# Patient Record
Sex: Female | Born: 1955 | Race: White | Hispanic: No | Marital: Married | Smoking: Never smoker
Health system: Southern US, Community
[De-identification: ages and names within clinical notes are randomized; demographics above are authoritative.]

## PROBLEM LIST (undated history)

## (undated) HISTORY — PX: CHOLECYSTECTOMY: SHX55

## (undated) HISTORY — PX: ANKLE SURGERY: SHX546

## (undated) HISTORY — PX: WRIST SURGERY: SHX841

---

## 1998-07-01 ENCOUNTER — Other Ambulatory Visit: Admission: RE | Admit: 1998-07-01 | Discharge: 1998-07-01 | Payer: Self-pay | Admitting: Obstetrics and Gynecology

## 1999-08-23 ENCOUNTER — Other Ambulatory Visit: Admission: RE | Admit: 1999-08-23 | Discharge: 1999-08-23 | Payer: Self-pay | Admitting: Obstetrics and Gynecology

## 2000-05-01 ENCOUNTER — Encounter: Admission: RE | Admit: 2000-05-01 | Discharge: 2000-05-01 | Payer: Self-pay | Admitting: Obstetrics and Gynecology

## 2000-05-01 ENCOUNTER — Encounter: Payer: Self-pay | Admitting: Obstetrics and Gynecology

## 2000-09-26 ENCOUNTER — Other Ambulatory Visit: Admission: RE | Admit: 2000-09-26 | Discharge: 2000-09-26 | Payer: Self-pay | Admitting: Obstetrics and Gynecology

## 2001-10-07 ENCOUNTER — Other Ambulatory Visit: Admission: RE | Admit: 2001-10-07 | Discharge: 2001-10-07 | Payer: Self-pay | Admitting: Obstetrics and Gynecology

## 2001-12-03 HISTORY — PX: BREAST BIOPSY: SHX20

## 2002-11-20 ENCOUNTER — Encounter: Admission: RE | Admit: 2002-11-20 | Discharge: 2002-11-20 | Payer: Self-pay | Admitting: Obstetrics and Gynecology

## 2002-11-20 ENCOUNTER — Encounter (INDEPENDENT_AMBULATORY_CARE_PROVIDER_SITE_OTHER): Payer: Self-pay | Admitting: Specialist

## 2002-11-20 ENCOUNTER — Encounter: Payer: Self-pay | Admitting: Obstetrics and Gynecology

## 2002-12-30 ENCOUNTER — Other Ambulatory Visit: Admission: RE | Admit: 2002-12-30 | Discharge: 2002-12-30 | Payer: Self-pay | Admitting: Obstetrics and Gynecology

## 2003-12-31 ENCOUNTER — Other Ambulatory Visit: Admission: RE | Admit: 2003-12-31 | Discharge: 2003-12-31 | Payer: Self-pay | Admitting: Obstetrics and Gynecology

## 2005-08-07 ENCOUNTER — Other Ambulatory Visit: Admission: RE | Admit: 2005-08-07 | Discharge: 2005-08-07 | Payer: Self-pay | Admitting: Obstetrics and Gynecology

## 2009-02-01 ENCOUNTER — Ambulatory Visit (HOSPITAL_COMMUNITY): Admission: RE | Admit: 2009-02-01 | Discharge: 2009-02-01 | Payer: Self-pay | Admitting: Family Medicine

## 2009-05-09 ENCOUNTER — Encounter (INDEPENDENT_AMBULATORY_CARE_PROVIDER_SITE_OTHER): Payer: Self-pay | Admitting: General Surgery

## 2009-05-09 ENCOUNTER — Ambulatory Visit (HOSPITAL_COMMUNITY): Admission: RE | Admit: 2009-05-09 | Discharge: 2009-05-09 | Payer: Self-pay | Admitting: General Surgery

## 2011-03-12 LAB — COMPREHENSIVE METABOLIC PANEL
ALT: 25 U/L (ref 0–35)
AST: 22 U/L (ref 0–37)
Albumin: 3.8 g/dL (ref 3.5–5.2)
Alkaline Phosphatase: 69 U/L (ref 39–117)
BUN: 11 mg/dL (ref 6–23)
CO2: 27 mEq/L (ref 19–32)
Calcium: 9.4 mg/dL (ref 8.4–10.5)
Chloride: 105 mEq/L (ref 96–112)
Creatinine, Ser: 0.81 mg/dL (ref 0.4–1.2)
GFR calc Af Amer: >60 mL/min (ref >60)
GFR calc non Af Amer: >60 mL/min (ref >60)
Glucose, Bld: 130 mg/dL — ABNORMAL HIGH (ref 70–99)
Potassium: 4.8 mEq/L (ref 3.5–5.1)
Sodium: 138 mEq/L (ref 135–145)
Total Bilirubin: 0.7 mg/dL (ref 0.3–1.2)
Total Protein: 6.7 g/dL (ref 6.0–8.3)

## 2011-03-12 LAB — CBC
HCT: 38.5 % (ref 36.0–46.0)
Hemoglobin: 12.9 g/dL (ref 12.0–15.0)
MCHC: 33.6 g/dL (ref 30.0–36.0)
MCV: 93.4 fL (ref 78.0–100.0)
Platelets: 174 10*3/uL (ref 150–400)
RBC: 4.12 MIL/uL (ref 3.87–5.11)
RDW: 13.4 % (ref 11.5–15.5)
WBC: 5.7 10*3/uL (ref 4.0–10.5)

## 2011-03-12 LAB — DIFFERENTIAL
Basophils Absolute: 0 10*3/uL (ref 0.0–0.1)
Basophils Relative: 1 % (ref 0–1)
Eosinophils Absolute: 0.1 10*3/uL (ref 0.0–0.7)
Eosinophils Relative: 3 % (ref 0–5)
Lymphocytes Relative: 37 % (ref 12–46)
Lymphs Abs: 2.1 10*3/uL (ref 0.7–4.0)
Monocytes Absolute: 0.4 10*3/uL (ref 0.1–1.0)
Monocytes Relative: 7 % (ref 3–12)
Neutro Abs: 3 10*3/uL (ref 1.7–7.7)
Neutrophils Relative %: 53 % (ref 43–77)

## 2011-03-12 LAB — PREGNANCY, URINE: Preg Test, Ur: NEGATIVE

## 2011-04-17 NOTE — Op Note (Signed)
NAME:  Cindy Roman, Cindy Roman                ACCOUNT NO.:  [id number]   MEDICAL RECORD NO.:  [id number]          PATIENT TYPE:  AMB   LOCATION:  DAY                          FACILITY:  [facility]   PHYSICIAN:  Anselm Pancoast. Weatherly, M.D.DATE OF BIRTH:  Aug 25, 1956   DATE OF PROCEDURE:  05/09/2009  DATE OF DISCHARGE:                               OPERATIVE REPORT   PREOPERATIVE DIAGNOSES:  Biliary dyskinesia with possible small  gallstones.   POSTOPERATIVE DIAGNOSIS:  Biliary dyskinesia with a single small  cholesterol stone within the wall of the gallbladder.   OPERATIONS:  Laparoscopic cholecystectomy with cholangiogram.   SURGEON:  Anselm Pancoast. Zachery Dakins, M.D.   ASSISTANT:  Dr. Glenna Fellows.   General anesthesia.   HISTORY:  Cindy Roman is a 55 year old Caucasian female who has had  recurrent episodes of epigastric pain.  They appear not very frequent.  First started having probably 4 or 5 years ago and then more recently a  little bit more frequent than once a year.  The pain usually starts in  the epigastric area, then will radiate to the small her back, more  intense pain, and she had an ultrasound of her gallbladder approximately  2 years ago that did not show any stone.  At that time, they saw her  medical physician, Dr. Laurann Montana.  They did lab studies which were  normal, and they did a hepatobiliary scan that showed a 60% ejection  fraction.  She did not want to proceed on with a cholecystectomy since  the x-ray did not show gallstones, but then she had a more intense pain  recently and recommended that she see a Development worker, international aid.   On review of her symptoms, her symptoms are definitely typical for  gallbladder problems, and I did not repeat the liver function studies  then, but I saw her back in March, and then she had a repeat episode of  pain more recently and wanted to proceed on with a cholecystectomy.  She  is here for the planned procedure and states that she has not  actually  had a further episode of pain since I saw the office approximately 2  weeks ago.  Her repeat liver function studies are again normal.  Her  white count is normal, and she is allergic to PENICILLIN and was given  Cipro.   Induction of general anesthesia, endotracheal tube, oral tube into the  stomach, and she has a little small fascial defect at the umbilicus, and  I made the incision really right over the umbilicus, so I could go  through this little fascial defect.  There was a little preperitoneal  fatty tissue that protruded up, and we actually removed that and put a  tie Vicryl on the actual peritoneal area and then put the Hasson cannula  through the little fascial defect.  The gallbladder was visualized with  inflation of the CO2.  There were a lot of chronic thickening and  adhesions around it.  The upper 10 mL trocar was placed under direct  vision.  Two trocars were placed lateral after anesthetizing the fascia.  The gallbladder was retracted outward and laterally, and the adhesions  to the duodenum and the omentum were carefully dissected from this.  Hemostasis obtained with cautery.  In the proximal portion of the  gallbladder, you could see the area where the gallbladder was possibly  kind of twisted up under the biliary system, kind of if the gallbladder  would get overly distended, I am sure it would partially cause these  symptoms that were possibly biliary dyskinesia.  The gallbladder was  retracted upward and outward.  The cystic duct was small, and cystic  artery had been identified, and I doubly clipped it proximally, singly  and distally, and did not divide it until after the cholangiogram was  obtained.  The little incision was made in the cystic duct proximal to  the clip, cholangiocatheter inserted.  X-ray obtained after holding in  place with clip, and the extrahepatic biliary system was very small,  good flow into the duodenum, and you could see the  left and right  radical of the intrahepatic ducts.  This catheter was removed.  The  cystic duct was triply clipped and then divided.  The posterior branch  of the cystic artery was like was identified, doubly clipped proximally,  and then divided, and the gallbladder was removed from its bed using the  hook electrocautery.  The gallbladder, I opened at the completion of  surgery, and there was one small cholesterol stone sort of embedded in  the most distal portion of the gallbladder, and she did not have mark  cholesterolosis of the surface of the gallbladder wall.  The bed had  been irrigated, slightly touch a couple of areas with the spatula  electrocautery and then withdrew the bag containing the gallbladder  through the fascia at the umbilicus.  I then put two figure-of-eight  Vicryls in the fascia at the umbilicus with good hemostasis.  Then  anesthetized the fascia at the umbilicus with Marcaine with adrenaline.  All total about 25 mL of this was used.  The subcutaneous wounds were  closed with 4-0 Vicryl, Benzoin, and Steri-Strips.  I put a single  fascia suture in the subxiphoid trocar site.  The patient tolerated the  procedure nicely.  I think she wants to go home this afternoon, and she  will be seen in the office in approximately 2 weeks.      Anselm Pancoast. Zachery Dakins, M.D.  Electronically Signed     WJW/MEDQ  D:  05/09/2009  T:  05/09/2009  Job:  [id number]

## 2014-07-07 ENCOUNTER — Emergency Department (HOSPITAL_BASED_OUTPATIENT_CLINIC_OR_DEPARTMENT_OTHER): Payer: 59

## 2014-07-07 ENCOUNTER — Encounter (HOSPITAL_BASED_OUTPATIENT_CLINIC_OR_DEPARTMENT_OTHER): Payer: Self-pay | Admitting: Emergency Medicine

## 2014-07-07 ENCOUNTER — Emergency Department (HOSPITAL_BASED_OUTPATIENT_CLINIC_OR_DEPARTMENT_OTHER)
Admission: EM | Admit: 2014-07-07 | Discharge: 2014-07-07 | Disposition: A | Discharge status: Home / Self Care | Payer: 59 | Attending: Emergency Medicine | Admitting: Emergency Medicine

## 2014-07-07 DIAGNOSIS — S52599A Other fractures of lower end of unspecified radius, initial encounter for closed fracture: Secondary | ICD-10-CM | POA: Diagnosis not present

## 2014-07-07 DIAGNOSIS — Z880 Allergy status to penicillin: Secondary | ICD-10-CM | POA: Insufficient documentation

## 2014-07-07 DIAGNOSIS — S52501A Unspecified fracture of the lower end of right radius, initial encounter for closed fracture: Secondary | ICD-10-CM

## 2014-07-07 DIAGNOSIS — S59919A Unspecified injury of unspecified forearm, initial encounter: Secondary | ICD-10-CM

## 2014-07-07 DIAGNOSIS — S59909A Unspecified injury of unspecified elbow, initial encounter: Secondary | ICD-10-CM | POA: Insufficient documentation

## 2014-07-07 DIAGNOSIS — S6990XA Unspecified injury of unspecified wrist, hand and finger(s), initial encounter: Secondary | ICD-10-CM | POA: Diagnosis present

## 2014-07-07 DIAGNOSIS — Y929 Unspecified place or not applicable: Secondary | ICD-10-CM | POA: Diagnosis not present

## 2014-07-07 DIAGNOSIS — Y9389 Activity, other specified: Secondary | ICD-10-CM | POA: Diagnosis not present

## 2014-07-07 DIAGNOSIS — R296 Repeated falls: Secondary | ICD-10-CM | POA: Diagnosis not present

## 2014-07-07 MED ORDER — OXYCODONE-ACETAMINOPHEN 5-325 MG PO TABS
1.0000 | ORAL_TABLET | Freq: Once | ORAL | Status: AC
  Administered 2014-07-07: 1 via ORAL
  Filled 2014-07-07: qty 1

## 2014-07-07 MED ORDER — IBUPROFEN 400 MG PO TABS
400.0000 mg | ORAL_TABLET | Freq: Once | ORAL | Status: AC
  Administered 2014-07-07: 400 mg via ORAL
  Filled 2014-07-07: qty 1

## 2014-07-07 MED ORDER — OXYCODONE-ACETAMINOPHEN 5-325 MG PO TABS: 2.0000 | ORAL_TABLET | ORAL | Status: DC | PRN

## 2014-07-07 MED ORDER — ONDANSETRON 8 MG PO TBDP
8.0000 mg | ORAL_TABLET | Freq: Once | ORAL | Status: AC
  Administered 2014-07-07: 8 mg via ORAL
  Filled 2014-07-07: qty 1

## 2014-07-07 NOTE — ED Notes (Addendum)
Pt back from xray, alert, NAD, calm, guarding movements, wrist splinted, ambulatory with steady gait.

## 2014-07-07 NOTE — ED Notes (Signed)
cardboard splint applied in triage-Patient transported to X-ray

## 2014-07-07 NOTE — ED Notes (Signed)
Dr. Rancour at BS.  

## 2014-07-07 NOTE — ED Notes (Addendum)
Fell injured right wrist approx 20 min PTA-deformity noted-pt has ice in plastic bag in place upon arrival

## 2014-07-07 NOTE — ED Provider Notes (Signed)
CSN: [id number]     Arrival date & time 07/07/14  1959 History  This chart was scribed for No att. providers found by Donato Schultz, ED Scribe. This patient was seen in room MH05/MH05 and the patient's care was started at 8:46 PM.     Chief Complaint  Patient presents with  . Wrist Injury    Patient is a 58 y.o. female presenting with wrist injury. The history is provided by the patient. No language interpreter was used.  Wrist Injury  HPI Comments: Cindy Roman is a 58 y.o. female who presents to the Emergency Department complaining of constant right wrist pain that started an hour and a half ago today after she fell backwards onto her wrist.  She states that her head jerked back at the time of the fall but denies hitting her head at the time of the incident or losing consciousness.  She denies neck pain or headache as associated symptoms.  She took pain medication upon arrival to the ED.  She is not taking Coumadin or aspirin.  She does not have any other medical problems.  She is right hand dominant.   History reviewed. No pertinent past medical history. Past Surgical History  Procedure Laterality Date  . Ankle surgery     No family history on file. History  Substance Use Topics  . Smoking status: Never Smoker   . Smokeless tobacco: Not on file  . Alcohol Use: Yes   OB History   Grav Para Term Preterm Abortions TAB SAB Ect Mult Living                 Review of Systems A complete 10 system review of systems was obtained and all systems are negative except as noted in the HPI and PMH.     Allergies  Penicillins  Home Medications   Prior to Admission medications   Medication Sig Start Date End Date Taking? Authorizing Provider  oxyCODONE-acetaminophen (PERCOCET/ROXICET) 5-325 MG per tablet Take 2 tablets by mouth every 4 (four) hours as needed for severe pain. 07/07/14   Ezequiel Essex, MD   Triage Vitals: BP 117/59  Pulse 69  Temp(Src) 98.5 F (36.9 C) (Oral)  Resp  20  Ht 5\' 8"  (1.727 m)  Wt 190 lb (86.183 kg)  BMI 28.90 kg/m2  SpO2 100%  Physical Exam  Nursing note and vitals reviewed. Constitutional: She is oriented to person, place, and time. She appears well-developed and well-nourished. No distress.  HENT:  Head: Normocephalic and atraumatic.  Mouth/Throat: Oropharynx is clear and moist. No oropharyngeal exudate.  Eyes: Conjunctivae and EOM are normal. Pupils are equal, round, and reactive to light.  Neck: Normal range of motion. Neck supple.  No meningismus.  Cardiovascular: Normal rate, regular rhythm, normal heart sounds and intact distal pulses.   No murmur heard. Pulmonary/Chest: Effort normal and breath sounds normal. No respiratory distress.  Abdominal: Soft. There is no tenderness. There is no rebound and no guarding.  Musculoskeletal: Normal range of motion. She exhibits no edema and no tenderness.  Right wrist - swelling and deformity.  Small abrasion to the ulnar side.  Intact distal pulse.  Cardinal hand movements intact.  Abrasion right elbow with no bony tenderness.   C-spine is non-tender.  T- and L-spine non-tender.   Neurological: She is alert and oriented to person, place, and time. No cranial nerve deficit. She exhibits normal muscle tone. Coordination normal.  No ataxia on finger to nose bilaterally. No pronator drift. 5/5  strength throughout. CN 2-12 intact. Negative Romberg. Equal grip strength. Sensation intact. Gait is normal.   Skin: Skin is warm.  Psychiatric: She has a normal mood and affect. Her behavior is normal.    ED Course  Procedures (including critical care time)  DIAGNOSTIC STUDIES: Oxygen Saturation is 100% on room air, normal by my interpretation.    COORDINATION OF CARE: 8:52 PM- Discussed the patient's radiology findings which revealed a fracture to her right wrist.  Discussed calling the hand specialist and the patient agreed to the treatment plan.    Labs Review Labs Reviewed - No data to  display  Imaging Review Dg Wrist Complete Right  07/07/2014   CLINICAL DATA:  WRIST INJURY WRIST INJURY  EXAM: RIGHT WRIST - COMPLETE 3+ VIEW  COMPARISON:  None.  FINDINGS: Comminuted fracture of the distal radius, extending to the distal radioulnar joint and the radiocarpal joint. There is impaction of fracture fragments, and mild dorsal angulation of the distal radial articular surface. Approximately 1 mm distraction of subchondral fracture fragments. Distal ulna appears intact. Carpal rows intact.  IMPRESSION: 1. Comminuted intra-articular distal radial fracture with mild dorsal angulation.   Electronically Signed   By: Arne Cleveland M.D.   On: 07/07/2014 20:40     EKG Interpretation None      MDM   Final diagnoses:  Distal radius fracture, right, closed, initial encounter   mechanical fall backwards onto right wrist. Did not hit head or lose consciousness. Complains of pain and swelling right wrist. Neurovascular intact. Small abrasion, not open fracture. No other injuries. X-ray shows comminuted distal radius fracture with dorsal angulation.  Discussed with Dr. Burney Gauze. He recommends sugar tong splint and will see in office tomorrow. Fracture wil likely need surgery. He did not recommend reduction tonight.  No other injuries apparent.  FROM R elbow and hand.   I personally performed the services described in this documentation, which was scribed in my presence. The recorded information has been reviewed and is accurate.   Ezequiel Essex, MD 07/08/14 [phone]

## 2014-07-07 NOTE — Discharge Instructions (Signed)
Radial Fracture Follow up with Dr. Burney Gauze tomorrow. Return to the ED if you develop worsening pain, numbness, tingling, or any other concerns. You have a broken bone (fracture) of the forearm. This is the part of your arm between the elbow and your wrist. Your forearm is made up of two bones. These are the radius and ulna. Your fracture is in the radial shaft. This is the bone in your forearm located on the thumb side. A cast or splint is used to protect and keep your injured bone from moving. The cast or splint will be on generally for about 5 to 6 weeks, with individual variations. HOME CARE INSTRUCTIONS   Keep the injured part elevated while sitting or lying down. Keep the injury above the level of your heart (the center of the chest). This will decrease swelling and pain.  Apply ice to the injury for 15-20 minutes, 03-04 times per day while awake, for 2 days. Put the ice in a plastic bag and place a towel between the bag of ice and your cast or splint.  Move your fingers to avoid stiffness and minimize swelling.  If you have a plaster or fiberglass cast:  Do not try to scratch the skin under the cast using sharp or pointed objects.  Check the skin around the cast every day. You may put lotion on any red or sore areas.  Keep your cast dry and clean.  If you have a plaster splint:  Wear the splint as directed.  You may loosen the elastic around the splint if your fingers become numb, tingle, or turn cold or blue.  Do not put pressure on any part of your cast or splint. It may break. Rest your cast only on a pillow for the first 24 hours until it is fully hardened.  Your cast or splint can be protected during bathing with a plastic bag. Do not lower the cast or splint into water.  Only take over-the-counter or prescription medicines for pain, discomfort, or fever as directed by your caregiver. SEEK IMMEDIATE MEDICAL CARE IF:   Your cast gets damaged or breaks.  You have more  severe pain or swelling than you did before getting the cast.  You have severe pain when stretching your fingers.  There is a bad smell, new stains and/or pus-like (purulent) drainage coming from under the cast.  Your fingers or hand turn pale or blue and become cold or your loose feeling. Document Released: 05/02/2006 Document Revised: 02/11/2012 Document Reviewed: 07/29/2006 [facility] Patient Information 2015 [place], [place]. This information is not intended to replace advice given to you by your health care provider. Make sure you discuss any questions you have with your health care provider.

## 2014-09-06 ENCOUNTER — Other Ambulatory Visit: Payer: Self-pay | Admitting: Obstetrics and Gynecology

## 2014-09-07 LAB — CYTOLOGY - PAP

## 2017-05-20 ENCOUNTER — Ambulatory Visit (INDEPENDENT_AMBULATORY_CARE_PROVIDER_SITE_OTHER): Payer: 59

## 2017-05-20 ENCOUNTER — Ambulatory Visit (INDEPENDENT_AMBULATORY_CARE_PROVIDER_SITE_OTHER): Payer: 59 | Admitting: Orthopedic Surgery

## 2017-05-20 ENCOUNTER — Encounter (INDEPENDENT_AMBULATORY_CARE_PROVIDER_SITE_OTHER): Payer: Self-pay | Admitting: Orthopedic Surgery

## 2017-05-20 VITALS — Ht 68.0 in | Wt 190.0 lb

## 2017-05-20 DIAGNOSIS — M25561 Pain in right knee: Secondary | ICD-10-CM

## 2017-05-20 DIAGNOSIS — M1611 Unilateral primary osteoarthritis, right hip: Secondary | ICD-10-CM

## 2017-05-20 DIAGNOSIS — M25551 Pain in right hip: Secondary | ICD-10-CM

## 2017-05-20 DIAGNOSIS — M1711 Unilateral primary osteoarthritis, right knee: Secondary | ICD-10-CM

## 2017-05-20 NOTE — Progress Notes (Signed)
Office Visit Note   Patient: Cindy Roman           Date of Birth: [birth year]           MRN: [id number] Visit Date: 05/20/2017              Requested by: No referring provider defined for this encounter. PCP: Carol Ada, MD  Chief Complaint  Patient presents with  . Right Hip - Pain  . Right Knee - Pain      HPI: Patient is a 61 year old woman who states she's had right knee pain for several weeks that happened while doing lunges. She states she has decreased range of motion of right hip and right knee. She states she has a catching sensation at start up with the right hip. The right hip is been bothering her for several years.  Assessment & Plan: Visit Diagnoses:  1. Acute pain of right knee   2. Pain in right hip   3. Unilateral primary osteoarthritis, right hip   4. Unilateral primary osteoarthritis, right knee     Plan: Recommended closed chain exercises as well as lunges and step ups to improve the hip and knee muscles. Discussed proper mechanics. Recommended over-the-counter anti-inflammatories and discussed that we could consider an injection for the right knee. Patient will most likely require a total hip and total knee arthroplasty in the future.  Follow-Up Instructions: Return if symptoms worsen or fail to improve.   Ortho Exam  Patient is alert, oriented, no adenopathy, well-dressed, normal affect, normal respiratory effort. Examination patient does have an abductor lurch on the right. Patient has essentially no range of motion of the right hip she has internal rotation 0 external rotation of 20 she has about 30 of internal and external rotation of the left hip. Examination of right knee she only has range of motion from 0-70. There is no effusion she is tender to palpation of the patellofemoral joint collaterals and cruciates are stable she is tender to palpation of the medial and lateral joint lines.  Imaging: Xr Hip Unilat W Or W/o Pelvis 2-3 Views  Right  Result Date: 05/20/2017 Two-view radiographs of the right hip shows joint space narrowing with subchondral sclerosis. The joint is not congruent.  Xr Knee 1-2 Views Right  Result Date: 05/20/2017 Two-view radiographs of the right knee shows periarticular bony spurs in all 3 compartments. There are no subcondylar  cysts there is joint space narrowing medially.   Labs: No results found for: HGBA1C, ESRSEDRATE, CRP, LABURIC, REPTSTATUS, GRAMSTAIN, CULT, LABORGA  Orders:  Orders Placed This Encounter  Procedures  . XR Knee 1-2 Views Right  . XR HIP UNILAT W OR W/O PELVIS 2-3 VIEWS RIGHT   No orders of the defined types were placed in this encounter.    Procedures: No procedures performed  Clinical Data: No additional findings.  ROS:  All other systems negative, except as noted in the HPI. Review of Systems  Objective: Vital Signs: Ht 5\' 8"  (1.727 m)   Wt 190 lb (86.2 kg)   BMI 28.89 kg/m   Specialty Comments:  No specialty comments available.  PMFS History: Patient Active Problem List   Diagnosis Date Noted  . Unilateral primary osteoarthritis, right hip 05/20/2017  . Unilateral primary osteoarthritis, right knee 05/20/2017   No past medical history on file.  No family history on file.  Past Surgical History:  Procedure Laterality Date  . ANKLE SURGERY     Social History  Occupational History  . Not on file.   Social History Main Topics  . Smoking status: Never Smoker  . Smokeless tobacco: Never Used  . Alcohol use Yes  . Drug use: No  . Sexual activity: Not on file

## 2018-04-24 ENCOUNTER — Ambulatory Visit (INDEPENDENT_AMBULATORY_CARE_PROVIDER_SITE_OTHER): Payer: 59 | Admitting: Orthopedic Surgery

## 2018-04-24 ENCOUNTER — Ambulatory Visit (INDEPENDENT_AMBULATORY_CARE_PROVIDER_SITE_OTHER): Payer: 59

## 2018-04-24 ENCOUNTER — Encounter (INDEPENDENT_AMBULATORY_CARE_PROVIDER_SITE_OTHER): Payer: Self-pay | Admitting: Orthopedic Surgery

## 2018-04-24 VITALS — Ht 68.0 in | Wt 190.0 lb

## 2018-04-24 DIAGNOSIS — M25562 Pain in left knee: Secondary | ICD-10-CM

## 2018-04-24 DIAGNOSIS — L97521 Non-pressure chronic ulcer of other part of left foot limited to breakdown of skin: Secondary | ICD-10-CM | POA: Insufficient documentation

## 2018-04-24 MED ORDER — LIDOCAINE HCL 1 % IJ SOLN
5.0000 mL | INTRAMUSCULAR | Status: AC | PRN
  Administered 2018-04-24: 5 mL

## 2018-04-24 MED ORDER — METHYLPREDNISOLONE ACETATE 40 MG/ML IJ SUSP
40.0000 mg | INTRAMUSCULAR | Status: AC | PRN
  Administered 2018-04-24: 40 mg via INTRA_ARTICULAR

## 2018-04-24 NOTE — Progress Notes (Addendum)
Office Visit Note   Patient: Cindy Roman           Date of Birth: [birth year]           MRN: [id number] Visit Date: 04/24/2018              Requested by: Carol Ada, [address] [place], [place] [address] PCP: Carol Ada, MD  Chief Complaint  Patient presents with  . Left Knee - Follow-up, Pain, Injury, Edema      HPI: Patient is a 62 year old woman who presents with ulceration left foot third toe as well as left knee pain for over a week.  Patient states she was walking twisted and had immediate onset of pain in the medial joint line she complains of swelling tingling burning sensation she is taken Advil without relief she has used a knee brace without relief.  Assessment & Plan: Visit Diagnoses:  1. Left knee pain, unspecified chronicity   2. Toe ulcer, left, limited to breakdown of skin ([place])     Plan: We will have her wear the knee-high medical compression stocking to help with the ulceration to the third toe to wear this around the clock if possible.  The left knee was injected from the anterior medial portal she tolerated this well discussed that if this does not work we may need to obtain an MRI scan to evaluate for a meniscal tear.  She is leaving for [place] in July.  Follow-Up Instructions: Return in about 3 weeks (around 05/15/2018).   Ortho Exam  Patient is alert, oriented, no adenopathy, well-dressed, normal affect, normal respiratory effort. Examination patient has good pulses she has no redness or cellulitis around the left knee collaterals and cruciates are stable she is point tender to palpation of medial joint line there is crepitation with range of motion of the patellofemoral joint.  Examination of the third toe she has blistering ulcer over the toe which may be due to compression and fungal.  There is no paronychial infection.  Photographs were obtained.  Imaging: Xr Knee 1-2 Views Left  Result Date: 04/24/2018 2 view radiographs  of the left knee shows osteophytic bone from spurs in the patellofemoral joint with mild joint space narrowing medially with no spurs medial or lateral.    Labs: No results found for: HGBA1C, ESRSEDRATE, CRP, LABURIC, REPTSTATUS, GRAMSTAIN, CULT, LABORGA   Lab Results  Component Value Date   ALBUMIN 3.8 05/03/2009    Body mass index is 28.89 kg/m.  Orders:  Orders Placed This Encounter  Procedures  . Large Joint Inj: L knee  . XR Knee 1-2 Views Left   Meds ordered this encounter  Medications  . lidocaine (XYLOCAINE) 1 % (with pres) injection 5 mL  . methylPREDNISolone acetate (DEPO-MEDROL) injection 40 mg     Procedures: Large Joint Inj: L knee on 04/24/2018 9:02 AM Indications: pain and diagnostic evaluation Details: 22 G 1.5 in needle, anteromedial approach  Arthrogram: No  Medications: 5 mL lidocaine 1 %; 40 mg methylPREDNISolone acetate 40 MG/ML Outcome: tolerated well, no immediate complications Procedure, treatment alternatives, risks and benefits explained, specific risks discussed. Consent was given by the patient. Immediately prior to procedure a time out was called to verify the correct patient, procedure, equipment, support staff and site/side marked as required. Patient was prepped and draped in the usual sterile fashion.      Clinical Data: No additional findings.  ROS:  All other systems negative, except as noted  in the HPI. Review of Systems  Objective: Vital Signs: Ht 5\' 8"  (1.727 m)   Wt 190 lb (86.2 kg)   BMI 28.89 kg/m   Specialty Comments:  No specialty comments available.  PMFS History: Patient Active Problem List   Diagnosis Date Noted  . Left knee pain 04/24/2018  . Toe ulcer, left, limited to breakdown of skin ([place]) 04/24/2018  . Unilateral primary osteoarthritis, right hip 05/20/2017  . Unilateral primary osteoarthritis, right knee 05/20/2017   History reviewed. No pertinent past medical history.  History reviewed. No  pertinent family history.  Past Surgical History:  Procedure Laterality Date  . ANKLE SURGERY     Social History   Occupational History  . Not on file  Tobacco Use  . Smoking status: Never Smoker  . Smokeless tobacco: Never Used  Substance and Sexual Activity  . Alcohol use: Yes  . Drug use: No  . Sexual activity: Not on file

## 2018-05-15 ENCOUNTER — Ambulatory Visit (INDEPENDENT_AMBULATORY_CARE_PROVIDER_SITE_OTHER): Payer: 59 | Admitting: Orthopedic Surgery

## 2018-05-31 ENCOUNTER — Ambulatory Visit (INDEPENDENT_AMBULATORY_CARE_PROVIDER_SITE_OTHER): Payer: 59 | Admitting: Podiatry

## 2018-05-31 DIAGNOSIS — L539 Erythematous condition, unspecified: Secondary | ICD-10-CM

## 2018-05-31 DIAGNOSIS — T148XXA Other injury of unspecified body region, initial encounter: Secondary | ICD-10-CM

## 2018-05-31 MED ORDER — SULFAMETHOXAZOLE-TRIMETHOPRIM 800-160 MG PO TABS: 1.0000 | ORAL_TABLET | Freq: Two times a day (BID) | ORAL | 0 refills | Status: DC

## 2018-05-31 MED ORDER — CLOTRIMAZOLE-BETAMETHASONE 1-0.05 % EX CREA: 1.0000 "application " | TOPICAL_CREAM | Freq: Two times a day (BID) | CUTANEOUS | 0 refills | Status: DC

## 2018-05-31 NOTE — Progress Notes (Signed)
Subjective:   Patient ID: Cindy Roman, female   DOB: 62 y.o.   MRN: [id number]   HPI 62 year old female presents to the office today for concerns of pain, blister and redness to the left third toe which is been ongoing the last 3 weeks.  She states that she has some swelling and some redness that it blistered.  She was soaking Epsom salts in the doing better but she still gets pain and redness to the toe.  She says all the blisters that she has had a popped and she points to one towards the distal portion of the toe and she also points to the dorsal PIPJ where she had one.  She states that it does itch at times.  She did try some over-the-counter antifungal without any improvement.  She is leaving to go to [place] tomorrow   Review of Systems  All other systems reviewed and are negative.  No past medical history on file.  Past Surgical History:  Procedure Laterality Date  . ANKLE SURGERY       Current Outpatient Medications:  .  clotrimazole-betamethasone (LOTRISONE) cream, Apply 1 application topically 2 (two) times daily., Disp: 30 g, Rfl: 0 .  sulfamethoxazole-trimethoprim (BACTRIM DS,SEPTRA DS) 800-160 MG tablet, Take 1 tablet by mouth 2 (two) times daily., Disp: 20 tablet, Rfl: 0  Allergies  Allergen Reactions  . Penicillins Hives        Objective:  Physical Exam  General: AAO x3, NAD  Dermatological: There is localized minimal edema to the distal portion left third toe however there is some mild erythema to the distal portion of the toe starting distal to the PIPJ distally.  There is no ascending cellulitis.  There is evidence of an old blister to the distal portion of the toe there is currently no fluid present within the area.  There is no drainage or pus identified.  There is no open skin lesions identified.  Pre-ulcerative area to the dorsal PIPJ of the third toe.  Vascular: Dorsalis Pedis artery and Posterior Tibial artery pedal pulses are 2/4 bilateral with immedate  capillary fill time.  There is no pain with calf compression, swelling, warmth, erythema.   Neruologic: Grossly intact via light touch bilateral.Protective threshold with Semmes Wienstein monofilament intact to all pedal sites bilateral.   Musculoskeletal: Multiple overlapping digital deformities are present.  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  Muscular strength 5/5 in all groups tested bilateral.  Gait: Unassisted, Nonantalgic.       Assessment:   62 year old female with erythema, blistering left third toe    Plan:  -Treatment options discussed including all alternatives, risks, and complications -Etiology of symptoms were discussed -Due to the erythema as well as blisters we will start antibiotics.  Prescribed Bactrim.  Also prescribed Lotrisone cream for concern for athlete's foot.  Discussed external measures to help with fungus inside of her shoes.  I do think that she probably started off of the fungus secondary bacterial infection. -Monitor for any clinical signs or symptoms of infection and directed to call the office immediately should any occur or go to the ER. -Follow-up once he comes back medication as needed sooner if any issues are to arise.  She agrees with this plan has no further questions.  Trula Slade DPM

## 2019-04-10 ENCOUNTER — Telehealth (INDEPENDENT_AMBULATORY_CARE_PROVIDER_SITE_OTHER): Payer: 59 | Admitting: Podiatry

## 2019-04-10 DIAGNOSIS — B353 Tinea pedis: Secondary | ICD-10-CM | POA: Diagnosis not present

## 2019-04-10 MED ORDER — TERBINAFINE HCL 250 MG PO TABS: 250.0000 mg | ORAL_TABLET | Freq: Every day | ORAL | 0 refills | Status: DC

## 2019-04-10 MED ORDER — CLOTRIMAZOLE-BETAMETHASONE 1-0.05 % EX CREA: 1.0000 "application " | TOPICAL_CREAM | Freq: Two times a day (BID) | CUTANEOUS | 2 refills | Status: DC

## 2019-04-10 NOTE — Patient Instructions (Signed)

## 2019-04-12 NOTE — Progress Notes (Signed)
Virtual Visit via Video Note  I connected with Cindy Roman on 04/12/19 at 11:45 AM EDT by a video enabled telemedicine application and verified that I am speaking with the correct person using two identifiers.  Location: Patient: home Provider: office  Visit was completed through [facility].    I discussed the limitations of evaluation and management by telemedicine and the availability of in person appointments. The patient expressed understanding and agreed to proceed.  History of Present Illness: 63 year old female has had itching, redness as well as blisters forming in between her toes for the last 6 to 9 months.  She states that she gets quite a bit of itching between her toes.  She states that at times a blister will form and drain.  She denies any pus.  No red streaks or swelling.  She is also started to get the same pain on her thumb since that she has been itching.  She has tried over-the-counter medication without any improvement.   Observations/Objective: Upon my evaluation states there is erythema interdigitally as well as macerated tissue.  There is no ascending cellulitis I can appreciate.  There is no edema.  There is no drainage like identified today.  Assessment and Plan: Tinea pedis, interdigital maceration  Follow Up Instructions: This time we had a discussion regards to multiple treatment options.  We do 2 weeks of Lamisil discussed side effects of medication.  Also prescribed Lotrimin cream.  We discussed shoe modifications and making sure she dries thoroughly between the toes we also discussed Betadine interdigitally.  Discussed make sure she is keeping her shoes as well as surfaces including showers going to help prevent the spread.  She has developed nail fungus.  Discussed measures to help prevent spreading.   I discussed the assessment and treatment plan with the patient. The patient was provided an opportunity to ask questions and all were answered. The patient  agreed with the plan and demonstrated an understanding of the instructions.   The patient was advised to call back or seek an in-person evaluation if the symptoms worsen or if the condition fails to improve as anticipated.  I provided 12 minutes of non-face-to-face time during this encounter.   Trula Slade, DPM

## 2019-10-26 ENCOUNTER — Other Ambulatory Visit: Payer: Self-pay

## 2019-10-26 DIAGNOSIS — Z20822 Contact with and (suspected) exposure to covid-19: Secondary | ICD-10-CM

## 2019-10-27 LAB — NOVEL CORONAVIRUS, NAA: SARS-CoV-2, NAA: NOT DETECTED

## 2019-12-04 DIAGNOSIS — C50919 Malignant neoplasm of unspecified site of unspecified female breast: Secondary | ICD-10-CM

## 2019-12-04 HISTORY — DX: Malignant neoplasm of unspecified site of unspecified female breast: C50.919

## 2020-03-10 DIAGNOSIS — H903 Sensorineural hearing loss, bilateral: Secondary | ICD-10-CM | POA: Insufficient documentation

## 2020-09-20 ENCOUNTER — Other Ambulatory Visit: Payer: Self-pay | Admitting: Obstetrics and Gynecology

## 2020-09-20 DIAGNOSIS — R928 Other abnormal and inconclusive findings on diagnostic imaging of breast: Secondary | ICD-10-CM

## 2020-09-21 ENCOUNTER — Other Ambulatory Visit: Payer: Self-pay | Admitting: Obstetrics and Gynecology

## 2020-09-21 DIAGNOSIS — R928 Other abnormal and inconclusive findings on diagnostic imaging of breast: Secondary | ICD-10-CM

## 2020-09-26 ENCOUNTER — Other Ambulatory Visit: Payer: Self-pay

## 2020-09-26 ENCOUNTER — Ambulatory Visit
Admission: RE | Admit: 2020-09-26 | Discharge: 2020-09-26 | Disposition: A | Discharge status: Home / Self Care | Payer: No Typology Code available for payment source | Source: Ambulatory Visit | Attending: Obstetrics and Gynecology | Admitting: Obstetrics and Gynecology

## 2020-09-26 ENCOUNTER — Other Ambulatory Visit: Payer: Self-pay | Admitting: Obstetrics and Gynecology

## 2020-09-26 DIAGNOSIS — R928 Other abnormal and inconclusive findings on diagnostic imaging of breast: Secondary | ICD-10-CM

## 2020-09-26 DIAGNOSIS — R921 Mammographic calcification found on diagnostic imaging of breast: Secondary | ICD-10-CM

## 2020-10-03 ENCOUNTER — Ambulatory Visit
Admission: RE | Admit: 2020-10-03 | Discharge: 2020-10-03 | Disposition: A | Discharge status: Home / Self Care | Payer: No Typology Code available for payment source | Source: Ambulatory Visit | Attending: Obstetrics and Gynecology | Admitting: Obstetrics and Gynecology

## 2020-10-03 ENCOUNTER — Other Ambulatory Visit: Payer: Self-pay

## 2020-10-03 DIAGNOSIS — R921 Mammographic calcification found on diagnostic imaging of breast: Secondary | ICD-10-CM

## 2020-10-12 ENCOUNTER — Encounter: Payer: Self-pay | Admitting: Adult Health

## 2020-10-12 DIAGNOSIS — Z170 Estrogen receptor positive status [ER+]: Secondary | ICD-10-CM | POA: Insufficient documentation

## 2020-10-12 DIAGNOSIS — D0511 Intraductal carcinoma in situ of right breast: Secondary | ICD-10-CM | POA: Insufficient documentation

## 2020-10-13 ENCOUNTER — Telehealth: Payer: Self-pay | Admitting: Hematology and Oncology

## 2020-10-13 NOTE — Telephone Encounter (Signed)
Received an urgent referral from Dr. [name] for DCIS. Pt has been cld and scheduled to see Dr. Lindi Adie on 11/16 at 1pm. I lft the appt date and time on the pt's vm.

## 2020-10-17 NOTE — Progress Notes (Addendum)
[facility] CONSULT NOTE  Patient Care Team: Carol Ada, MD as PCP - General (Family Medicine) Mauro Kaufmann, RN as Oncology Nurse Navigator Rockwell Germany, RN as Oncology Nurse Navigator  CHIEF COMPLAINTS/PURPOSE OF CONSULTATION:  Newly diagnosed DCIS right breast  HISTORY O DCISF PRESENTING ILLNESS:  Cindy Roman 64 y.o. female is here because of recent diagnosis of right breast DCIS. Screening mammogram on 09/13/20 showed right breast calcifications. Diagnostic mammogram on 09/26/20 showed right breast calcifications spanning 2.0cm. 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 She presents to the clinic today for initial evaluation and discussion of treatment options.   I reviewed her records extensively and collaborated the history with the patient.  SUMMARY OF ONCOLOGIC HISTORY: Oncology History  Ductal carcinoma in situ (DCIS) of right breast  10/12/2020 Initial Diagnosis   Screening mammogram showed right breast calcifications, spanning 2.0cm. 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    10/12/2020 Cancer Staging   Staging form: Breast, AJCC 8th Edition - Clinical stage from 10/12/2020: Stage 0 (cTis (DCIS), cN0, cM0, ER+, PR+) - Signed by Gardenia Phlegm, NP on 10/12/2020      MEDICAL HISTORY:  No past medical history on file.  SURGICAL HISTORY: Past Surgical History:  Procedure Laterality Date  . ANKLE SURGERY    . BREAST BIOPSY Left 2003    SOCIAL HISTORY: Social History   Socioeconomic History  . Marital status: Married    Spouse name: Not on file  . Number of children: Not on file  . Years of education: Not on file  . Highest education level: Not on file  Occupational History  . Not on file  Tobacco Use  . Smoking status: Never Smoker  . Smokeless tobacco: Never Used  Substance and Sexual Activity  .  Alcohol use: Yes  . Drug use: No  . Sexual activity: Not on file  Other Topics Concern  . Not on file  Social History Narrative  . Not on file   Social Determinants of Health   Financial Resource Strain:   . Difficulty of Paying Living Expenses: Not on file  Food Insecurity:   . Worried About Charity fundraiser in the Last Year: Not on file  . Ran Out of Food in the Last Year: Not on file  Transportation Needs:   . Lack of Transportation (Medical): Not on file  . Lack of Transportation (Non-Medical): Not on file  Physical Activity:   . Days of Exercise per Week: Not on file  . Minutes of Exercise per Session: Not on file  Stress:   . Feeling of Stress : Not on file  Social Connections:   . Frequency of Communication with Friends and Family: Not on file  . Frequency of Social Gatherings with Friends and Family: Not on file  . Attends Religious Services: Not on file  . Active Member of Clubs or Organizations: Not on file  . Attends Archivist Meetings: Not on file  . Marital Status: Not on file  Intimate Partner Violence:   . Fear of Current or Ex-Partner: Not on file  . Emotionally Abused: Not on file  . Physically Abused: Not on file  . Sexually Abused: Not on file    FAMILY HISTORY: No family history on file.  ALLERGIES:  is allergic to penicillins.  MEDICATIONS:  Current Outpatient Medications  Medication Sig Dispense Refill  . clotrimazole-betamethasone (LOTRISONE) cream Apply 1 application topically 2 (two) times daily. 30 g 2  . terbinafine (LAMISIL) 250 MG tablet Take 1 tablet (250 mg total) by mouth daily. 14 tablet 0   No current facility-administered medications for this visit.    REVIEW OF SYSTEMS:   As above all other systems are negative    PHYSICAL EXAMINATION: ECOG PERFORMANCE STATUS: 1 - Symptomatic but completely ambulatory  Vitals:   10/18/20 1328  BP: 138/80  Pulse: 72  Resp: 17  Temp: 98.8 F (37.1 C)  SpO2: 98%   Filed  Weights   10/18/20 1328  Weight: 181 lb 8 oz (82.3 kg)       LABORATORY DATA:  I have reviewed the data as listed Lab Results  Component Value Date   WBC 5.7 05/03/2009   HGB 12.9 05/03/2009   HCT 38.5 05/03/2009   MCV 93.4 05/03/2009   PLT 174 05/03/2009   Lab Results  Component Value Date   NA 138 05/03/2009   K 4.8 05/03/2009   CL 105 05/03/2009   CO2 27 05/03/2009    RADIOGRAPHIC STUDIES: I have personally reviewed the radiological reports and agreed with the findings in the report.  ASSESSMENT AND PLAN:  Malignant neoplasm of central portion of right breast in female, estrogen receptor positive ([place]) 10/12/2020:Screening mammogram showed right breast calcifications, spanning 2.0cm. 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  Tis NX stage 0  Pathology review: I discussed with the patient the difference between DCIS and invasive breast cancer. It is considered a precancerous lesion. DCIS is classified as a 0. It is generally detected through mammograms as calcifications. We discussed the significance of grades and its impact on prognosis. We also discussed the importance of ER and PR receptors and their implications to adjuvant treatment options. Prognosis of DCIS dependence on grade, comedo necrosis. It is anticipated that if not treated, 20-30% of DCIS can develop into invasive breast cancer.  Recommendation: 1. Breast conserving surgery versus participation in COMET clinical trial 2. Followed by adjuvant radiation therapy if she undergoes breast conserving surgery 3. Followed by antiestrogen therapy with tamoxifen 5 years  Tamoxifen counseling: We discussed the risks and benefits of tamoxifen. These include but not limited to insomnia, hot flashes, mood changes, vaginal dryness, and weight gain. Although rare, serious side effects including endometrial cancer, risk of blood clots were also discussed. We strongly  believe that the benefits far outweigh the risks. Patient understands these risks and consented to starting treatment. Planned treatment duration is 5 years.  AFT 25 COMET Phase 3 clinical trial for low risk DCIS grade 1/2 PR positive, age greater than 40 randomized to surgery +/- radiation, +/- endocrine therapy versus active surveillance with +/- endocrine therapy surveillance with mammograms every 6 months for 5 years;patient's have option to decline elevated arm and still be followed on study   I requested a clinical research team to provide her with information on the DCIS study.  She is extremely motivated and is willing to participate in the trial. Return back to see me based upon randomization of the trial.    All questions were answered. The patient knows to call the clinic with any problems, questions or concerns.   Rulon Eisenmenger, MD, MPH 10/31/2020    I, Molly Dorshimer, am acting as scribe for Nicholas Lose, MD.  I have reviewed the above documentation for accuracy and completeness,  and I agree with the above.

## 2020-10-18 ENCOUNTER — Other Ambulatory Visit: Payer: Self-pay

## 2020-10-18 ENCOUNTER — Encounter: Payer: Self-pay | Admitting: *Deleted

## 2020-10-18 ENCOUNTER — Inpatient Hospital Stay
Payer: No Typology Code available for payment source | Attending: Hematology and Oncology | Admitting: Hematology and Oncology

## 2020-10-18 DIAGNOSIS — C50111 Malignant neoplasm of central portion of right female breast: Secondary | ICD-10-CM

## 2020-10-18 DIAGNOSIS — Z170 Estrogen receptor positive status [ER+]: Secondary | ICD-10-CM

## 2020-10-18 DIAGNOSIS — D0511 Intraductal carcinoma in situ of right breast: Secondary | ICD-10-CM | POA: Insufficient documentation

## 2020-10-18 NOTE — Research (Signed)
10/18/2020 at 3:01pm - Comet/AFT-25 study notes- Dr. Lindi Adie referred this pt as a potential subject for the COMET study.  The research nurse met with the pt and her husband for 20 minutes going over all aspects of the trial.  The pt was given the consent form and the COMET pt brochures to take home and read along with the research nurse's business card.  The pt was encouraged to read the consent form and to call the research nurse with any questions.  The pt was told that her study participation is completely voluntary.  The research nurse reviewed the study treatment arms with the pt as well the required study assessments.  The pt and the research nurse agreed to talk on Friday, 10/21/20, about her study participation.  The pt was thanked for her interest in the COMET trial.  The research nurse will begin reviewing the pt's medical chart in regards to the study eligibility criteria.   Brion Aliment RN, BSN, CCRP Clinical Research Nurse 10/18/2020 4:06 PM

## 2020-10-18 NOTE — Assessment & Plan Note (Signed)
10/12/2020:Screening mammogram showed right breast calcifications, spanning 2.0cm. 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  Tis NX stage 0  Pathology review: I discussed with the patient the difference between DCIS and invasive breast cancer. It is considered a precancerous lesion. DCIS is classified as a 0. It is generally detected through mammograms as calcifications. We discussed the significance of grades and its impact on prognosis. We also discussed the importance of ER and PR receptors and their implications to adjuvant treatment options. Prognosis of DCIS dependence on grade, comedo necrosis. It is anticipated that if not treated, 20-30% of DCIS can develop into invasive breast cancer.  Recommendation: 1. Breast conserving surgery versus participation in COMET clinical trial 2. Followed by adjuvant radiation therapy if she undergoes breast conserving surgery 3. Followed by antiestrogen therapy with tamoxifen 5 years  Tamoxifen counseling: We discussed the risks and benefits of tamoxifen. These include but not limited to insomnia, hot flashes, mood changes, vaginal dryness, and weight gain. Although rare, serious side effects including endometrial cancer, risk of blood clots were also discussed. We strongly believe that the benefits far outweigh the risks. Patient understands these risks and consented to starting treatment. Planned treatment duration is 5 years.  AFT 25 COMET Phase 3 clinical trial for low risk DCIS grade 1/2 PR positive, age greater than 40 randomized to surgery +/- radiation, +/- endocrine therapy versus active surveillance with +/- endocrine therapy surveillance with mammograms every 6 months for 5 years;patient's have option to decline elevated arm and still be followed on study

## 2020-10-19 ENCOUNTER — Encounter: Payer: Self-pay | Admitting: *Deleted

## 2020-10-19 ENCOUNTER — Telehealth: Payer: Self-pay | Admitting: *Deleted

## 2020-10-19 NOTE — Telephone Encounter (Signed)
Called pt to provide navigation resources and contact information. No answer, unable to leave vm d/t mailbox full. Will call again

## 2020-10-21 ENCOUNTER — Telehealth: Payer: Self-pay | Admitting: Hematology and Oncology

## 2020-10-21 ENCOUNTER — Telehealth: Payer: Self-pay | Admitting: *Deleted

## 2020-10-21 NOTE — Telephone Encounter (Signed)
No 11/16 los, no changes made to pt schedule

## 2020-10-21 NOTE — Telephone Encounter (Signed)
10/21/20 at 5:43pm - The research nurse called the pt today to see if the pt had any questions about the Comet study.  The pt said that her questions were written at home, and she would call the nurse back later in the afternoon.  The pt called the nurse at 4:45pm, and the nurse spent 30 minutes answering all of her study related questions.  The pt stated that she wanted to participate in the study, and the pt agreed to come to the [facility] on 10/31/20 at 10am to meet with the nurse to sign the consent form.  The pt was thanked for her consideration of this study.  The pt was encouraged to call the nurse if she had any further questions about participation in the study.  Brion Aliment RN, BSN, CCRP Clinical Research Nurse 10/21/2020 5:47 PM

## 2020-10-24 ENCOUNTER — Encounter: Payer: Self-pay | Admitting: Licensed Clinical Social Worker

## 2020-10-24 NOTE — Progress Notes (Signed)
[place] [employer] representative contacted patient by phone  to offer support and assess for needs for patient with newly diagnosed breast cancer (DCIS). Patient reports good support so far from family and friends. She is working and has not had any issues with time off for appointments. No basic resource needs at this time.  CSW and patient discussed the importance of support during treatment.  CSW informed patient of the support team and support services at [facility].  CSW provided contact information and encouraged patient to call with any questions or concerns.      Curtice, [place] Worker [employer]

## 2020-10-31 ENCOUNTER — Other Ambulatory Visit: Payer: Self-pay | Admitting: Hematology and Oncology

## 2020-10-31 ENCOUNTER — Other Ambulatory Visit: Payer: Self-pay

## 2020-10-31 ENCOUNTER — Inpatient Hospital Stay: Payer: No Typology Code available for payment source | Admitting: *Deleted

## 2020-10-31 ENCOUNTER — Encounter: Payer: Self-pay | Admitting: *Deleted

## 2020-10-31 DIAGNOSIS — C50111 Malignant neoplasm of central portion of right female breast: Secondary | ICD-10-CM

## 2020-10-31 NOTE — Research (Addendum)
10/31/2020 at 11:04am -AFT-25/COMET study notes-   "Comparing an Operation to Monitoring , with or without Endocrine Therapy (COMET) Trial for Low Risk DCIS: A Phase III Prospective Randomized Trial"  This Clinical Research Nurse met with Cindy Roman, [id number]  in a manner and location that ensures patient privacy to discuss participation in the above listed research study.  Patient is accompanied by her husband this morning.  Patient was previously provided with informed consent documents on 10/18/20. Patient confirmed they have read the informed consent documents.  As outlined in the informed consent form, this RN and DAISHA Roman discussed the purpose of the research study, the investigational nature of the study, study procedures and requirements for study participation, potential risks and benefits of study participation, as well as alternatives to participation.  The patient understands participation is voluntary, and she may withdraw from study participation at any time.   Each study arm was reviewed, and randomization was discussed. This study does not involve an investigational drug or device. This study does not involve a placebo.  Patient understands enrollment is pending full eligibility review. Confidentiality and how the patient's information will be used as part of study participation were discussed.  Patient was informed there is no reimbursement provided for her time and effort spent on trial participation.  The patient is encouraged to discuss research study participation with her insurance provider to determine what costs she may incur as part of study participation.  All questions were answered to patient's satisfaction.  The informed consent and HIPAA authorization were reviewed page by page.  The patient's mental and emotional status is appropriate to provide informed consent, and the patient verbalizes an understanding of study participation.  Patient has agreed to participate in  the above listed research study and has voluntarily signed the informed consent document version 5 dated 04/13/19 and HIPAA authorization on 10/31/20 at 10:45am.  The patient was provided with a copy of the signed informed consent form and HIPAA authorization for her reference.  No study specific procedures were obtained prior to the signing of the informed consent document.  The pt then completed her baseline questionnaires in 30 minutes.  The research nurse asked the pt several questions about her health at baseline.  The pt confirmed that she is post-menopausal.  She stated that her LMP was in 2012.  She stated that she had 3 full term pregnancies.  The pt stated she has 1 daughter born in 18, and she has 2 sons (born in [place]).  She said she was 64 years old at her first full tern pregnancy. The pt denied using any prior hormonal replacement therapy. The denied any other adverse event at baseline except for right hip pain (moderate as described by pt).  She specifically denied the following conditions:  High cholesterol, hypertension, myalgia, osteoporosis, and hot flashes  Approximately 90 minutes were spent with the patient this morning.  Brion Aliment RN, BSN, CCRP Clinical Research Nurse 10/31/2020 11:05 AM    The research nurse contacted Dr. Jeanmarie Plant, radiologist, and asked her to amend her 09/26/20 mammogram and include a "clock face position" of the pt's known DCIS.  The research nurse then contacted [facility] pathology to request the pt's tumor tissue. Brion Aliment RN, BSN, CCRP Clinical Research Nurse 10/31/2020 12:05 PM   11/03/2020 at 4:30pm Dr. Lindi Adie reviewed the pt's inclusion and exclusion criteria, and he confirmed that she met all the criteria for study entry.  The nurse and  a 2nd nurse, Otilio Miu, also confirmed that the pt met the eligibility criteria for enrollment.  The pt was successfully registered/randomized into the study.  The pt was randomized to the surgery  arm.  Dr. Lindi Adie, Bary Castilla (breast navigator), and the pt were all notified of the randomization arm.  The pt was hoping for the active monitoring arm, but she said that she wanted to participate in the study.  The pt was presented with all of her options, and the pt elected to proceed with surgery.  The pt asked that her surgery be after 12/12/20.  The pt must have her surgery within 60 days of randomization which is 01/02/21.  Dawn Stuart notified Dr. [name] of the pt's randomization to surgery.  The pt was told that she would need to have her research samples drawn within the next 4 weeks.  The research site has requested the pt's mammogram images be placed on a CD for study purposes.  The pt's tumor block was sent to the site.  Dr. Jeanmarie Plant amended the pt's mammogram with the clock face position of her DCIS.  The pt's baseline questionnaire and pt contact form was entered into the [employer] system. Brion Aliment RN, BSN, CCRP Clinical Research Nurse 11/04/2020 1:52 PM

## 2020-11-03 ENCOUNTER — Encounter: Payer: Self-pay | Admitting: *Deleted

## 2020-11-04 ENCOUNTER — Other Ambulatory Visit: Payer: Self-pay | Admitting: General Surgery

## 2020-11-04 DIAGNOSIS — D0511 Intraductal carcinoma in situ of right breast: Secondary | ICD-10-CM

## 2020-11-07 ENCOUNTER — Encounter: Payer: Self-pay | Admitting: *Deleted

## 2020-11-07 ENCOUNTER — Other Ambulatory Visit: Payer: Self-pay | Admitting: *Deleted

## 2020-11-07 DIAGNOSIS — D0511 Intraductal carcinoma in situ of right breast: Secondary | ICD-10-CM

## 2020-11-09 ENCOUNTER — Inpatient Hospital Stay: Payer: No Typology Code available for payment source | Attending: Hematology and Oncology

## 2020-11-09 ENCOUNTER — Other Ambulatory Visit: Payer: Self-pay

## 2020-11-09 DIAGNOSIS — D0511 Intraductal carcinoma in situ of right breast: Secondary | ICD-10-CM

## 2020-11-09 LAB — RESEARCH LABS

## 2020-11-10 ENCOUNTER — Other Ambulatory Visit: Payer: Self-pay | Admitting: General Surgery

## 2020-11-10 DIAGNOSIS — D0511 Intraductal carcinoma in situ of right breast: Secondary | ICD-10-CM

## 2020-11-21 ENCOUNTER — Encounter: Payer: Self-pay | Admitting: *Deleted

## 2020-11-21 ENCOUNTER — Encounter: Payer: Self-pay | Admitting: Hematology and Oncology

## 2020-11-21 DIAGNOSIS — D0511 Intraductal carcinoma in situ of right breast: Secondary | ICD-10-CM

## 2020-11-22 ENCOUNTER — Telehealth: Payer: Self-pay | Admitting: Hematology and Oncology

## 2020-11-22 NOTE — Telephone Encounter (Signed)
Scheduled appt per 12/20 sch msg - pt is aware of appt date and time   

## 2020-11-29 ENCOUNTER — Encounter (HOSPITAL_BASED_OUTPATIENT_CLINIC_OR_DEPARTMENT_OTHER): Payer: Self-pay | Admitting: General Surgery

## 2020-11-29 ENCOUNTER — Other Ambulatory Visit: Payer: Self-pay

## 2020-12-05 ENCOUNTER — Other Ambulatory Visit (HOSPITAL_COMMUNITY)
Admission: RE | Admit: 2020-12-05 | Discharge: 2020-12-05 | Disposition: A | Discharge status: Home / Self Care | Payer: No Typology Code available for payment source | Source: Ambulatory Visit | Attending: General Surgery | Admitting: General Surgery

## 2020-12-05 DIAGNOSIS — U071 COVID-19: Secondary | ICD-10-CM | POA: Diagnosis not present

## 2020-12-05 HISTORY — DX: COVID-19: U07.1

## 2020-12-05 MED ORDER — ENSURE PRE-SURGERY PO LIQD: 296.0000 mL | Freq: Once | ORAL | Status: DC

## 2020-12-06 ENCOUNTER — Telehealth: Payer: Self-pay | Admitting: Hematology and Oncology

## 2020-12-06 LAB — SARS CORONAVIRUS 2 (TAT 6-24 HRS): SARS Coronavirus 2: POSITIVE — AB

## 2020-12-06 NOTE — Telephone Encounter (Signed)
Rescheduled appointment per 1/4 schedule message. Patient is aware of changes. 

## 2020-12-06 NOTE — Progress Notes (Signed)
Notified Dr. Doreen Salvage office of patient's positive covid test.

## 2020-12-14 ENCOUNTER — Encounter: Payer: Self-pay | Admitting: *Deleted

## 2020-12-15 ENCOUNTER — Encounter (HOSPITAL_BASED_OUTPATIENT_CLINIC_OR_DEPARTMENT_OTHER): Payer: Self-pay | Admitting: General Surgery

## 2020-12-15 ENCOUNTER — Other Ambulatory Visit: Payer: Self-pay

## 2020-12-15 NOTE — Progress Notes (Signed)
Completed pre op call.  Pt dx with Covid 12/05/2020. She denies fever or worsening symptoms. States she still has an unproductive cough and has called her medical doctor.  Informed pt to call surgery center and surgeon if symptoms get any worse.

## 2020-12-19 ENCOUNTER — Other Ambulatory Visit (HOSPITAL_COMMUNITY): Payer: No Typology Code available for payment source

## 2020-12-20 ENCOUNTER — Ambulatory Visit: Payer: No Typology Code available for payment source | Admitting: Hematology and Oncology

## 2020-12-21 ENCOUNTER — Ambulatory Visit
Admission: RE | Admit: 2020-12-21 | Discharge: 2020-12-21 | Disposition: A | Discharge status: Home / Self Care | Payer: No Typology Code available for payment source | Source: Ambulatory Visit | Attending: General Surgery | Admitting: General Surgery

## 2020-12-21 ENCOUNTER — Other Ambulatory Visit: Payer: Self-pay

## 2020-12-21 DIAGNOSIS — D0511 Intraductal carcinoma in situ of right breast: Secondary | ICD-10-CM

## 2020-12-22 ENCOUNTER — Ambulatory Visit (HOSPITAL_BASED_OUTPATIENT_CLINIC_OR_DEPARTMENT_OTHER)
Admission: RE | Admit: 2020-12-22 | Discharge: 2020-12-22 | Disposition: A | Discharge status: Home / Self Care | Payer: No Typology Code available for payment source | Attending: General Surgery | Admitting: General Surgery

## 2020-12-22 ENCOUNTER — Ambulatory Visit (HOSPITAL_BASED_OUTPATIENT_CLINIC_OR_DEPARTMENT_OTHER): Payer: No Typology Code available for payment source | Admitting: Certified Registered Nurse Anesthetist

## 2020-12-22 ENCOUNTER — Encounter (HOSPITAL_BASED_OUTPATIENT_CLINIC_OR_DEPARTMENT_OTHER)
Admission: RE | Disposition: A | Discharge status: Home / Self Care | Payer: Self-pay | Source: Home / Self Care | Attending: General Surgery

## 2020-12-22 ENCOUNTER — Ambulatory Visit
Admission: RE | Admit: 2020-12-22 | Discharge: 2020-12-22 | Disposition: A | Discharge status: Home / Self Care | Payer: No Typology Code available for payment source | Source: Ambulatory Visit | Attending: General Surgery | Admitting: General Surgery

## 2020-12-22 ENCOUNTER — Other Ambulatory Visit: Payer: Self-pay

## 2020-12-22 ENCOUNTER — Encounter (HOSPITAL_BASED_OUTPATIENT_CLINIC_OR_DEPARTMENT_OTHER): Payer: Self-pay | Admitting: General Surgery

## 2020-12-22 DIAGNOSIS — D0511 Intraductal carcinoma in situ of right breast: Secondary | ICD-10-CM | POA: Insufficient documentation

## 2020-12-22 DIAGNOSIS — Z880 Allergy status to penicillin: Secondary | ICD-10-CM | POA: Insufficient documentation

## 2020-12-22 DIAGNOSIS — N6489 Other specified disorders of breast: Secondary | ICD-10-CM | POA: Insufficient documentation

## 2020-12-22 DIAGNOSIS — Z170 Estrogen receptor positive status [ER+]: Secondary | ICD-10-CM | POA: Diagnosis not present

## 2020-12-22 HISTORY — PX: BREAST LUMPECTOMY WITH RADIOACTIVE SEED LOCALIZATION: SHX6424

## 2020-12-22 HISTORY — PX: BREAST LUMPECTOMY: SHX2

## 2020-12-22 SURGERY — BREAST LUMPECTOMY WITH RADIOACTIVE SEED LOCALIZATION
Anesthesia: General | Site: Breast | Laterality: Right

## 2020-12-22 MED ORDER — FENTANYL CITRATE (PF) 100 MCG/2ML IJ SOLN
25.0000 ug | INTRAMUSCULAR | Status: DC | PRN
  Administered 2020-12-22: 50 ug via INTRAVENOUS

## 2020-12-22 MED ORDER — CEFAZOLIN SODIUM-DEXTROSE 2-3 GM-%(50ML) IV SOLR
INTRAVENOUS | Status: DC | PRN
  Administered 2020-12-22: 2 g via INTRAVENOUS

## 2020-12-22 MED ORDER — AMISULPRIDE (ANTIEMETIC) 5 MG/2ML IV SOLN: 10.0000 mg | Freq: Once | INTRAVENOUS | Status: DC | PRN

## 2020-12-22 MED ORDER — ACETAMINOPHEN 500 MG PO TABS
1000.0000 mg | ORAL_TABLET | ORAL | Status: AC
  Administered 2020-12-22: 1000 mg via ORAL

## 2020-12-22 MED ORDER — DEXAMETHASONE SODIUM PHOSPHATE 10 MG/ML IJ SOLN
INTRAMUSCULAR | Status: DC | PRN
  Administered 2020-12-22: 10 mg via INTRAVENOUS

## 2020-12-22 MED ORDER — ACETAMINOPHEN 500 MG PO TABS
ORAL_TABLET | ORAL | Status: AC
  Filled 2020-12-22: qty 2

## 2020-12-22 MED ORDER — FENTANYL CITRATE (PF) 100 MCG/2ML IJ SOLN
INTRAMUSCULAR | Status: DC | PRN
  Administered 2020-12-22: 50 ug via INTRAVENOUS
  Administered 2020-12-22: 25 ug via INTRAVENOUS

## 2020-12-22 MED ORDER — CEFAZOLIN SODIUM-DEXTROSE 2-4 GM/100ML-% IV SOLN: 2.0000 g | INTRAVENOUS | Status: DC

## 2020-12-22 MED ORDER — 0.9 % SODIUM CHLORIDE (POUR BTL) OPTIME
TOPICAL | Status: DC | PRN
  Administered 2020-12-22: 50 mL

## 2020-12-22 MED ORDER — BUPIVACAINE HCL (PF) 0.25 % IJ SOLN
INTRAMUSCULAR | Status: DC | PRN
  Administered 2020-12-22: 10 mL

## 2020-12-22 MED ORDER — LACTATED RINGERS IV SOLN: INTRAVENOUS | Status: DC | PRN

## 2020-12-22 MED ORDER — LACTATED RINGERS IV SOLN: INTRAVENOUS | Status: DC

## 2020-12-22 MED ORDER — LIDOCAINE HCL (CARDIAC) PF 100 MG/5ML IV SOSY
PREFILLED_SYRINGE | INTRAVENOUS | Status: DC | PRN
  Administered 2020-12-22: 60 mg via INTRAVENOUS

## 2020-12-22 MED ORDER — ONDANSETRON HCL 4 MG/2ML IJ SOLN
INTRAMUSCULAR | Status: DC | PRN
  Administered 2020-12-22: 4 mg via INTRAVENOUS

## 2020-12-22 MED ORDER — PROPOFOL 10 MG/ML IV BOLUS
INTRAVENOUS | Status: AC
  Filled 2020-12-22: qty 20

## 2020-12-22 MED ORDER — CEFAZOLIN SODIUM-DEXTROSE 2-4 GM/100ML-% IV SOLN
INTRAVENOUS | Status: AC
  Filled 2020-12-22: qty 100

## 2020-12-22 MED ORDER — PROPOFOL 10 MG/ML IV BOLUS
INTRAVENOUS | Status: DC | PRN
  Administered 2020-12-22: 200 mg via INTRAVENOUS

## 2020-12-22 MED ORDER — FENTANYL CITRATE (PF) 100 MCG/2ML IJ SOLN
INTRAMUSCULAR | Status: AC
  Filled 2020-12-22: qty 2

## 2020-12-22 MED ORDER — ONDANSETRON HCL 4 MG/2ML IJ SOLN
INTRAMUSCULAR | Status: AC
  Filled 2020-12-22: qty 2

## 2020-12-22 SURGICAL SUPPLY — 58 items
ADH SKN CLS APL DERMABOND .7 (GAUZE/BANDAGES/DRESSINGS) ×1
APL PRP STRL LF DISP 70% ISPRP (MISCELLANEOUS) ×1
APPLIER CLIP 9.375 MED OPEN (MISCELLANEOUS)
APR CLP MED 9.3 20 MLT OPN (MISCELLANEOUS)
BINDER BREAST LRG (GAUZE/BANDAGES/DRESSINGS) IMPLANT
BINDER BREAST MEDIUM (GAUZE/BANDAGES/DRESSINGS) IMPLANT
BINDER BREAST XLRG (GAUZE/BANDAGES/DRESSINGS) IMPLANT
BINDER BREAST XXLRG (GAUZE/BANDAGES/DRESSINGS) IMPLANT
BLADE SURG 15 STRL LF DISP TIS (BLADE) ×1 IMPLANT
BLADE SURG 15 STRL SS (BLADE) ×2
CANISTER SUC SOCK COL 7IN (MISCELLANEOUS) IMPLANT
CANISTER SUCT 1200ML W/VALVE (MISCELLANEOUS) IMPLANT
CHLORAPREP W/TINT 26 (MISCELLANEOUS) ×2 IMPLANT
CLIP APPLIE 9.375 MED OPEN (MISCELLANEOUS) IMPLANT
CLIP VESOCCLUDE SM WIDE 6/CT (CLIP) IMPLANT
COVER BACK TABLE 60X90IN (DRAPES) ×2 IMPLANT
COVER MAYO STAND STRL (DRAPES) ×2 IMPLANT
COVER PROBE W GEL 5X96 (DRAPES) IMPLANT
COVER WAND RF STERILE (DRAPES) IMPLANT
DECANTER SPIKE VIAL GLASS SM (MISCELLANEOUS) IMPLANT
DERMABOND ADVANCED (GAUZE/BANDAGES/DRESSINGS) ×1
DERMABOND ADVANCED .7 DNX12 (GAUZE/BANDAGES/DRESSINGS) ×1 IMPLANT
DRAPE GAMMA PROBE CRDLSS 10X38 (DRAPES) ×2 IMPLANT
DRAPE LAPAROSCOPIC ABDOMINAL (DRAPES) ×2 IMPLANT
DRAPE UTILITY XL STRL (DRAPES) ×2 IMPLANT
DRSG TEGADERM 4X4.75 (GAUZE/BANDAGES/DRESSINGS) IMPLANT
ELECT COATED BLADE 2.86 ST (ELECTRODE) ×2 IMPLANT
ELECT REM PT RETURN 9FT ADLT (ELECTROSURGICAL) ×2
ELECTRODE REM PT RTRN 9FT ADLT (ELECTROSURGICAL) ×1 IMPLANT
GAUZE SPONGE 4X4 12PLY STRL LF (GAUZE/BANDAGES/DRESSINGS) IMPLANT
GLOVE SURG ENC MOIS LTX SZ7 (GLOVE) ×4 IMPLANT
GLOVE SURG UNDER POLY LF SZ7.5 (GLOVE) ×2 IMPLANT
GOWN STRL REUS W/ TWL LRG LVL3 (GOWN DISPOSABLE) ×2 IMPLANT
GOWN STRL REUS W/TWL LRG LVL3 (GOWN DISPOSABLE) ×4
HEMOSTAT ARISTA ABSORB 3G PWDR (HEMOSTASIS) IMPLANT
KIT MARKER MARGIN INK (KITS) ×2 IMPLANT
NEEDLE HYPO 25X1 1.5 SAFETY (NEEDLE) ×2 IMPLANT
NS IRRIG 1000ML POUR BTL (IV SOLUTION) IMPLANT
PACK BASIN DAY SURGERY FS (CUSTOM PROCEDURE TRAY) ×2 IMPLANT
PENCIL SMOKE EVACUATOR (MISCELLANEOUS) ×2 IMPLANT
RETRACTOR ONETRAX LX 135X30 (MISCELLANEOUS) ×2 IMPLANT
RETRACTOR ONETRAX LX 90X20 (MISCELLANEOUS) IMPLANT
SLEEVE SCD COMPRESS KNEE MED (MISCELLANEOUS) ×2 IMPLANT
SPONGE LAP 4X18 RFD (DISPOSABLE) ×2 IMPLANT
STRIP CLOSURE SKIN 1/2X4 (GAUZE/BANDAGES/DRESSINGS) ×2 IMPLANT
SUT MNCRL AB 4-0 PS2 18 (SUTURE) ×4 IMPLANT
SUT MON AB 5-0 PS2 18 (SUTURE) IMPLANT
SUT SILK 2 0 SH (SUTURE) ×4 IMPLANT
SUT VIC AB 2-0 SH 27 (SUTURE) ×4
SUT VIC AB 2-0 SH 27XBRD (SUTURE) ×2 IMPLANT
SUT VIC AB 3-0 SH 27 (SUTURE) ×2
SUT VIC AB 3-0 SH 27X BRD (SUTURE) ×1 IMPLANT
SUT VIC AB 5-0 PS2 18 (SUTURE) IMPLANT
SYR CONTROL 10ML LL (SYRINGE) ×2 IMPLANT
TOWEL GREEN STERILE FF (TOWEL DISPOSABLE) ×2 IMPLANT
TRAY FAXITRON CT DISP (TRAY / TRAY PROCEDURE) ×2 IMPLANT
TUBE CONNECTING 20X1/4 (TUBING) IMPLANT
YANKAUER SUCT BULB TIP NO VENT (SUCTIONS) IMPLANT

## 2020-12-22 NOTE — Discharge Instructions (Signed)
Next dose of Tylenol can be given after 5:00PM.     Post Anesthesia Home Care Instructions  Activity: Get plenty of rest for the remainder of the day. A responsible individual must stay with you for 24 hours following the procedure.  For the next 24 hours, DO NOT: -Drive a car -Paediatric nurse -Drink alcoholic beverages -Take any medication unless instructed by your physician -Make any legal decisions or sign important papers.  Meals: Start with liquid foods such as gelatin or soup. Progress to regular foods as tolerated. Avoid greasy, spicy, heavy foods. If nausea and/or vomiting occur, drink only clear liquids until the nausea and/or vomiting subsides. Call your physician if vomiting continues.  Special Instructions/Symptoms: Your throat may feel dry or sore from the anesthesia or the breathing tube placed in your throat during surgery. If this causes discomfort, gargle with warm salt water. The discomfort should disappear within 24 hours.  If you had a scopolamine patch placed behind your ear for the management of post- operative nausea and/or vomiting:  1. The medication in the patch is effective for 72 hours, after which it should be removed.  Wrap patch in a tissue and discard in the trash. Wash hands thoroughly with soap and water. 2. You may remove the patch earlier than 72 hours if you experience unpleasant side effects which may include dry mouth, dizziness or visual disturbances. 3. Avoid touching the patch. Wash your hands with soap and water after contact with the patch.        [place] Office Phone Number [phone]  BREAST BIOPSY/ PARTIAL MASTECTOMY: POST OP INSTRUCTIONS Take 400 mg of ibuprofen every 8 hours or 650 mg tylenol every 6 hours for next 72 hours then as needed. Use ice several times daily also. Always review your discharge instruction sheet given to you by the facility where your surgery was performed.  IF YOU HAVE DISABILITY  OR FAMILY LEAVE FORMS, YOU MUST BRING THEM TO THE OFFICE FOR PROCESSING.  DO NOT GIVE THEM TO YOUR DOCTOR.  1. A prescription for pain medication may be given to you upon discharge.  Take your pain medication as prescribed, if needed.  If narcotic pain medicine is not needed, then you may take acetaminophen (Tylenol), naprosyn (Alleve) or ibuprofen (Advil) as needed. 2. Take your usually prescribed medications unless otherwise directed 3. If you need a refill on your pain medication, please contact your pharmacy.  They will contact our office to request authorization.  Prescriptions will not be filled after 5pm or on week-ends. 4. You should eat very light the first 24 hours after surgery, such as soup, crackers, pudding, etc.  Resume your normal diet the day after surgery. 5. Most patients will experience some swelling and bruising in the breast.  Ice packs and a good support bra will help.  Wear the breast binder provided or a sports bra for 72 hours day and night.  After that wear a sports bra during the day until you return to the office. Swelling and bruising can take several days to resolve.  6. It is common to experience some constipation if taking pain medication after surgery.  Increasing fluid intake and taking a stool softener will usually help or prevent this problem from occurring.  A mild laxative (Milk of Magnesia or Miralax) should be taken according to package directions if there are no bowel movements after 48 hours. 7. Unless discharge instructions indicate otherwise, you may remove your bandages 48 hours after surgery and  you may shower at that time.  You may have steri-strips (small skin tapes) in place directly over the incision.  These strips should be left on the skin for 7-10 days and will come off on their own.  If your surgeon used skin glue on the incision, you may shower in 24 hours.  The glue will flake off over the next 2-3 weeks.  Any sutures or staples will be removed at the  office during your follow-up visit. 8. ACTIVITIES:  You may resume regular daily activities (gradually increasing) beginning the next day.  Wearing a good support bra or sports bra minimizes pain and swelling.  You may have sexual intercourse when it is comfortable. a. You may drive when you no longer are taking prescription pain medication, you can comfortably wear a seatbelt, and you can safely maneuver your car and apply brakes. b. RETURN TO WORK:  ______________________________________________________________________________________ 9. You should see your doctor in the office for a follow-up appointment approximately two weeks after your surgery.  Your doctor's nurse will typically make your follow-up appointment when she calls you with your pathology report.  Expect your pathology report 3-4 business days after your surgery.  You may call to check if you do not hear from [place] after three days. 10. OTHER INSTRUCTIONS: _______________________________________________________________________________________________ _____________________________________________________________________________________________________________________________________ _____________________________________________________________________________________________________________________________________ _____________________________________________________________________________________________________________________________________  WHEN TO CALL DR WAKEFIELD: 1. Fever over 101.0 2. Nausea and/or vomiting. 3. Extreme swelling or bruising. 4. Continued bleeding from incision. 5. Increased pain, redness, or drainage from the incision.  The clinic staff is available to answer your questions during regular business hours.  Please don't hesitate to call and ask to speak to one of the nurses for clinical concerns.  If you have a medical emergency, go to the nearest emergency room or call 911.  A surgeon from [facility]  Surgery is always on call at the hospital.  For further questions, please visit [email or web address]

## 2020-12-22 NOTE — Op Note (Signed)
Preoperative diagnosis: Clinical stage 0 right breast cancer Postoperative diagnosis: Same as above Procedure: Right breast radioactive seed guided lumpectomy Surgeon: Dr. Serita Grammes Anesthesia: General  Estimated blood loss: minimal Specimens: 1. Right breast tissue marked with paint containing seed and clip 2. Additional anterior posterior,lateral and superior, and posterior margins marked short superior, long lateral, double deep Complications: None Drains: None Special count was correct completion Disposition recovery stable condition  Indications:  24 yof otherwise healthy female presents after screening mm showing b density breasts and 2 cm of right breast calcs. she has prior benign radiologic biopsy, no fh, no mass or dc. 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 She enrolled in [facility] and was randomized to standard therapy  Procedure: After informed consent was obtained the patient first underwent radioactive seed placement in the breast cancer. I had these images available for my review in the operating room. She was given antibiotics. SCDs were in place. She underwent a pectoral block with anesthesia. She was then placed under general anesthesia without complication. She was prepped and draped in standard sterile surgical fashion. A surgical timeout was then performed.  I infiltrated Marcaine and made a curvilinear incision in the very lateral right breast to hide the scar laterally. .  I then removed the seed with an attempt to remove a rim of normal tissue around it to get clear margins. Mammogram confirmed removal of the seed and the clip  I thought on 3D imaging I was close to several margins so I excised these and marked with suture.   The posterior margin is now the muscle.  I then obtained hemostasis. I placed clips in the cavity to mark for later radiation. I then closed the cavity with 2-0 Vicryl. The skin was closed  with 3-0 Vicryl 5-0 Monocryl. Glue and Steri-Strips were applied.

## 2020-12-22 NOTE — Transfer of Care (Signed)
Immediate Anesthesia Transfer of Care Note  Patient: Cindy Roman  Procedure(s) Performed: RIGHT BREAST LUMPECTOMY WITH RADIOACTIVE SEED LOCALIZATION (Right Breast)  Patient Location: PACU  Anesthesia Type:General  Level of Consciousness: awake, alert  and oriented  Airway & Oxygen Therapy: Patient Spontanous Breathing and Patient connected to face mask oxygen  Post-op Assessment: Report given to RN and Post -op Vital signs reviewed and stable  Post vital signs: Reviewed and stable  Last Vitals:  Vitals Value Taken Time  BP    Temp    Pulse 69 12/22/20 1259  Resp    SpO2 100 % 12/22/20 1259  Vitals shown include unvalidated device data.  Last Pain:  Vitals:   12/22/20 1055  TempSrc: Oral      Patients Stated Pain Goal: 3 (67/67/20 9470)  Complications: No complications documented.

## 2020-12-22 NOTE — H&P (Signed)
65 yof otherwise healthy female presents after screening mm showing b density breasts and 2 cm of right breast calcs. she has prior benign radiologic biopsy, no fh, no mass or dc. 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 she is a Investment banker, corporate. She enrolled in [facility] and was randomized to standard therapy.     Past Surgical History  Breast Biopsy  Right. Gallbladder Surgery - Laparoscopic   Diagnostic Studies History ( Colonoscopy  >10 years ago Mammogram  within last year Pap Smear  1-5 years ago  Allergies  Penicillins  Allergies Reconciled   Medication History  Multivitamins (Oral) Active. Medications Reconciled  Social History Alcohol use  Moderate alcohol use. Caffeine use  Carbonated beverages, Tea. Tobacco use  Never smoker.  Family History  Arthritis  Mother. Diabetes Mellitus  Mother.  Pregnancy / Birth History  Age at menarche  33 years. Age of menopause  39-60 Gravida  3 Length (months) of breastfeeding  3-6 Maternal age  44-30 Para  3   Review of Systems  General Not Present- Appetite Loss, Chills, Fatigue, Fever, Night Sweats, Weight Gain and Weight Loss. Skin Not Present- Change in Wart/Mole, Dryness, Hives, Jaundice, New Lesions, Non-Healing Wounds, Rash and Ulcer. HEENT Present- Seasonal Allergies. Not Present- Earache, Hearing Loss, Hoarseness, Nose Bleed, Oral Ulcers, Ringing in the Ears, Sinus Pain, Sore Throat, Visual Disturbances, Wears glasses/contact lenses and Yellow Eyes. Respiratory Not Present- Bloody sputum, Chronic Cough, Difficulty Breathing, Snoring and Wheezing. Breast Not Present- Breast Mass, Breast Pain, Nipple Discharge and Skin Changes. Cardiovascular Not Present- Chest Pain, Difficulty Breathing Lying Down, Leg Cramps, Palpitations, Rapid Heart Rate, Shortness of Breath and Swelling of Extremities. Gastrointestinal Not Present- Abdominal Pain, Bloating, Bloody Stool, Change  in Bowel Habits, Chronic diarrhea, Constipation, Difficulty Swallowing, Excessive gas, Gets full quickly at meals, Hemorrhoids, Indigestion, Nausea, Rectal Pain and Vomiting. Female Genitourinary Not Present- Frequency, Nocturia, Painful Urination, Pelvic Pain and Urgency. Musculoskeletal Present- Joint Stiffness. Not Present- Back Pain, Joint Pain, Muscle Pain, Muscle Weakness and Swelling of Extremities. Neurological Not Present- Decreased Memory, Fainting, Headaches, Numbness, Seizures, Tingling, Tremor, Trouble walking and Weakness. Psychiatric Not Present- Anxiety, Bipolar, Change in Sleep Pattern, Depression, Fearful and Frequent crying. Endocrine Not Present- Cold Intolerance, Excessive Hunger, Hair Changes, Heat Intolerance, Hot flashes and New Diabetes. Hematology Not Present- Blood Thinners, Easy Bruising, Excessive bleeding, Gland problems, HIV and Persistent Infections.   Physical Exam  General Mental Status-Alert. Orientation-Oriented X3. Breast Nipples-No Discharge. Breast Lump-No Palpable Breast Mass. Lymphatic Head & Neck General Head & Neck Lymphatics: Bilateral - Description - Normal. Axillary General Axillary Region: Bilateral - Description - Normal. Note: no [place] adenopathy  A/P:  Right breast seed guided lumpectomy We discussed the options for treatment of the breast cancer which included lumpectomy versus a mastectomy. We discussed the performance of the lumpectomy with radioactive seed placement. We discussed a 5-10% chance of a positive margin requiring reexcision in the operating room. We also discussed that she will likely need radiation therapy if she undergoes lumpectomy. We discussed mastectomy and the postoperative care for that as well. Mastectomy can be followed by reconstruction. The decision for lumpectomy vs mastectomy has no impact on decision for chemotherapy. Most mastectomy patients will not need radiation therapy. We discussed that there is no  difference in her survival whether she undergoes lumpectomy with radiation therapy or antiestrogen therapy versus a mastectomy. There is also no real difference between her recurrence in the breast. We discussed  the risks of operation including bleeding, infection, possible reoperation. She understands her further therapy will be based on what her stages at the time of her operation.

## 2020-12-22 NOTE — Anesthesia Postprocedure Evaluation (Signed)
Anesthesia Post Note  Patient: Cindy Roman  Procedure(s) Performed: RIGHT BREAST LUMPECTOMY WITH RADIOACTIVE SEED LOCALIZATION (Right Breast)     Patient location during evaluation: PACU Anesthesia Type: General Level of consciousness: awake and alert Pain management: pain level controlled Vital Signs Assessment: post-procedure vital signs reviewed and stable Respiratory status: spontaneous breathing, nonlabored ventilation, respiratory function stable and patient connected to nasal cannula oxygen Cardiovascular status: blood pressure returned to baseline and stable Postop Assessment: no apparent nausea or vomiting Anesthetic complications: no   No complications documented.  Last Vitals:  Vitals:   12/22/20 1330 12/22/20 1414  BP: 139/66 135/70  Pulse: (!) 51 (!) 54  Resp: 10 20  Temp:  36.7 C  SpO2: 99% 100%    Last Pain:  Vitals:   12/22/20 1414  TempSrc: Oral  PainSc: 2                  Tiajuana Amass

## 2020-12-22 NOTE — Anesthesia Preprocedure Evaluation (Signed)
Anesthesia Evaluation  Patient identified by MRN, date of birth, ID band Patient awake    Reviewed: Allergy & Precautions, NPO status , Patient's Chart, lab work & pertinent test results  Airway Mallampati: II  TM Distance: >3 FB Neck ROM: Full    Dental  (+) Dental Advisory Given   Pulmonary neg pulmonary ROS,    breath sounds clear to auscultation       Cardiovascular negative cardio ROS   Rhythm:Regular Rate:Normal     Neuro/Psych negative neurological ROS     GI/Hepatic negative GI ROS, Neg liver ROS,   Endo/Other  negative endocrine ROS  Renal/GU negative Renal ROS     Musculoskeletal  (+) Arthritis ,   Abdominal   Peds  Hematology negative hematology ROS (+)   Anesthesia Other Findings   Reproductive/Obstetrics                             Anesthesia Physical Anesthesia Plan  ASA: II  Anesthesia Plan: General   Post-op Pain Management:    Induction: Intravenous  PONV Risk Score and Plan: 3 and Midazolam, Dexamethasone, Ondansetron and Treatment may vary due to age or medical condition  Airway Management Planned: LMA  Additional Equipment:   Intra-op Plan:   Post-operative Plan: Extubation in OR  Informed Consent: I have reviewed the patients History and Physical, chart, labs and discussed the procedure including the risks, benefits and alternatives for the proposed anesthesia with the patient or authorized representative who has indicated his/her understanding and acceptance.     Dental advisory given  Plan Discussed with: CRNA  Anesthesia Plan Comments:         Anesthesia Quick Evaluation

## 2020-12-22 NOTE — Interval H&P Note (Signed)
History and Physical Interval Note:  12/22/2020 11:49 AM  Cindy Roman  has presented today for surgery, with the diagnosis of RIGHT BREAST DUCTAL CARCINOMA IN SITU.  The various methods of treatment have been discussed with the patient and family. After consideration of risks, benefits and other options for treatment, the patient has consented to  Procedure(s): RIGHT BREAST LUMPECTOMY WITH RADIOACTIVE SEED LOCALIZATION (Right) as a surgical intervention.  The patient's history has been reviewed, patient examined, no change in status, stable for surgery.  I have reviewed the patient's chart and labs.  Questions were answered to the patient's satisfaction.     Rolm Bookbinder

## 2020-12-22 NOTE — Anesthesia Procedure Notes (Signed)
Procedure Name: LMA Insertion Performed by: Ramonia Mcclaran M, CRNA Pre-anesthesia Checklist: Patient identified, Emergency Drugs available, Suction available and Patient being monitored Patient Re-evaluated:Patient Re-evaluated prior to induction Oxygen Delivery Method: Circle system utilized Preoxygenation: Pre-oxygenation with 100% oxygen Induction Type: IV induction Ventilation: Mask ventilation without difficulty LMA: LMA inserted LMA Size: 4.0 Number of attempts: 1 Airway Equipment and Method: Bite block Placement Confirmation: positive ETCO2 Tube secured with: Tape Dental Injury: Teeth and Oropharynx as per pre-operative assessment        

## 2020-12-23 ENCOUNTER — Encounter (HOSPITAL_BASED_OUTPATIENT_CLINIC_OR_DEPARTMENT_OTHER): Payer: Self-pay | Admitting: General Surgery

## 2020-12-26 NOTE — Progress Notes (Incomplete)
   Patient Care Team: Cindy Ada, MD as PCP - General (Family Medicine) Mauro Kaufmann, RN as Oncology Nurse Navigator Rockwell Germany, RN as Oncology Nurse Navigator  DIAGNOSIS: No diagnosis found.  SUMMARY OF ONCOLOGIC HISTORY: Oncology History  Ductal carcinoma in situ (DCIS) of right breast  10/12/2020 Initial Diagnosis   Screening mammogram showed right breast calcifications, spanning 2.0cm. 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    10/12/2020 Cancer Staging   Staging form: Breast, AJCC 8th Edition - Clinical stage from 10/12/2020: Stage 0 (cTis (DCIS), cN0, cM0, ER+, PR+) - Signed by Gardenia Phlegm, NP on 10/12/2020   12/22/2020 Surgery   Right lumpectomy [name]):      CHIEF COMPLIANT: Follow-up s/p right lumpectomy   INTERVAL HISTORY: Cindy Roman is a 65 y.o. with above-mentioned history of right breast DCIS. She is a participant in the [facility] clinical trial and was randomized to the surgery arm. She underwent a right lumpectomy on 12/22/20 with Dr. [name]. She presents to the clinic today to review the pathology report and discuss further treatment.   ALLERGIES:  is allergic to penicillins.  MEDICATIONS:  Current Outpatient Medications  Medication Sig Dispense Refill  . Ascorbic Acid (VITAMIN C PO) Take by mouth.    . Misc Natural Products (DAILY HERBS IMMUNE DEFENSE PO) Take by mouth.    . Multiple Vitamins-Minerals (PRESERVISION AREDS PO) Take by mouth.    . Nutritional Supplements (JUICE PLUS FIBRE PO) Take by mouth.     No current facility-administered medications for this visit.    PHYSICAL EXAMINATION: ECOG PERFORMANCE STATUS: {CHL ONC ECOG PS:[phone]}  There were no vitals filed for this visit. There were no vitals filed for this visit.  LABORATORY DATA:  I have reviewed the data as listed CMP Latest Ref Rng & Units 05/03/2009  Glucose 70 - 99 mg/dL 130(H)  BUN 6 - 23  mg/dL 11  Creatinine 0.4 - 1.2 mg/dL 0.81  Sodium 135 - 145 mEq/L 138  Potassium 3.5 - 5.1 mEq/L 4.8  Chloride 96 - 112 mEq/L 105  CO2 19 - 32 mEq/L 27  Calcium 8.4 - 10.5 mg/dL 9.4  Total Protein 6.0 - 8.3 g/dL 6.7  Total Bilirubin 0.3 - 1.2 mg/dL 0.7  Alkaline Phos 39 - 117 U/L 69  AST 0 - 37 U/L 22  ALT 0 - 35 U/L 25    Lab Results  Component Value Date   WBC 5.7 05/03/2009   HGB 12.9 05/03/2009   HCT 38.5 05/03/2009   MCV 93.4 05/03/2009   PLT 174 05/03/2009   NEUTROABS 3.0 05/03/2009    ASSESSMENT & PLAN:  No problem-specific Assessment & Plan notes found for this encounter.    No orders of the defined types were placed in this encounter.  The patient has a good understanding of the overall plan. she agrees with it. she will call with any problems that may develop before the next visit here.  Total time spent: *** mins including face to face time and time spent for planning, charting and coordination of care  Nicholas Lose, MD 12/26/2020  I, Cloyde Reams Dorshimer, am acting as scribe for Dr. Nicholas Lose.  {insert scribe attestation}

## 2020-12-27 ENCOUNTER — Inpatient Hospital Stay: Payer: No Typology Code available for payment source | Admitting: Hematology and Oncology

## 2020-12-27 ENCOUNTER — Encounter: Payer: Self-pay | Admitting: *Deleted

## 2020-12-27 ENCOUNTER — Telehealth: Payer: Self-pay | Admitting: *Deleted

## 2020-12-27 LAB — SURGICAL PATHOLOGY

## 2020-12-27 NOTE — Telephone Encounter (Signed)
Spoke with patient to reschedule her appointment with Dr. Lindi Adie due to her pathology is not complete yet.  Rescheduled and confirmed appointment for 1/28 at 10am.

## 2020-12-27 NOTE — Assessment & Plan Note (Deleted)
10/12/2020:Screening mammogram showed right breast calcifications, spanning 2.0cm. 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  Tis NX stage 0 [facility] clinical trial: Patient was randomized to surgery  12/22/2020: Right lumpectomy:

## 2020-12-29 NOTE — Progress Notes (Signed)
Patient Care Team: Carol Ada, MD as PCP - General (Family Medicine) Mauro Kaufmann, RN as Oncology Nurse Navigator Rockwell Germany, RN as Oncology Nurse Navigator  DIAGNOSIS:    ICD-10-CM   1. Ductal carcinoma in situ (DCIS) of right breast  D05.11     SUMMARY OF ONCOLOGIC HISTORY: Oncology History  Ductal carcinoma in situ (DCIS) of right breast  10/12/2020 Initial Diagnosis   Screening mammogram showed right breast calcifications, spanning 2.0cm. 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    10/12/2020 Cancer Staging   Staging form: Breast, AJCC 8th Edition - Clinical stage from 10/12/2020: Stage 0 (cTis (DCIS), cN0, cM0, ER+, PR+) - Signed by Gardenia Phlegm, NP on 10/12/2020   12/22/2020 Surgery   Right lumpectomy [name]): intermediate grade DCIS, 1.2cm, clear margins (COMET clinical trial)     CHIEF COMPLIANT: Follow-up s/p right lumpectomy   INTERVAL HISTORY: Cindy Roman is a 65 y.o. with above-mentioned history of right breast DCIS. She is a participant in the COMET clinical trial and was randomized to the surgery arm. She underwent a right lumpectomy on 12/22/20 with Dr. [name] for which pathology showed intermediate grade DCIS, 1.2cm, clear margins. She presents to the clinic today to review the pathology report and discuss further treatment.   ALLERGIES:  is allergic to penicillins.  MEDICATIONS:  Current Outpatient Medications  Medication Sig Dispense Refill  . tamoxifen (NOLVADEX) 20 MG tablet Take 1 tablet (20 mg total) by mouth daily. 90 tablet 3  . Ascorbic Acid (VITAMIN C PO) Take by mouth.    . Misc Natural Products (DAILY HERBS IMMUNE DEFENSE PO) Take by mouth.    . Multiple Vitamins-Minerals (PRESERVISION AREDS PO) Take by mouth.    . Nutritional Supplements (JUICE PLUS FIBRE PO) Take by mouth.     No current facility-administered medications for this visit.    PHYSICAL  EXAMINATION: ECOG PERFORMANCE STATUS: 1 - Symptomatic but completely ambulatory  Vitals:   12/30/20 1032  BP: (!) 142/79  Pulse: 74  Resp: 18  Temp: 98.1 F (36.7 C)  SpO2: 97%   Filed Weights   12/30/20 1032  Weight: 184 lb (83.5 kg)     LABORATORY DATA:  I have reviewed the data as listed CMP Latest Ref Rng & Units 05/03/2009  Glucose 70 - 99 mg/dL 130(H)  BUN 6 - 23 mg/dL 11  Creatinine 0.4 - 1.2 mg/dL 0.81  Sodium 135 - 145 mEq/L 138  Potassium 3.5 - 5.1 mEq/L 4.8  Chloride 96 - 112 mEq/L 105  CO2 19 - 32 mEq/L 27  Calcium 8.4 - 10.5 mg/dL 9.4  Total Protein 6.0 - 8.3 g/dL 6.7  Total Bilirubin 0.3 - 1.2 mg/dL 0.7  Alkaline Phos 39 - 117 U/L 69  AST 0 - 37 U/L 22  ALT 0 - 35 U/L 25    Lab Results  Component Value Date   WBC 5.7 05/03/2009   HGB 12.9 05/03/2009   HCT 38.5 05/03/2009   MCV 93.4 05/03/2009   PLT 174 05/03/2009   NEUTROABS 3.0 05/03/2009    ASSESSMENT & PLAN:  Ductal carcinoma in situ (DCIS) of right breast 12/22/2020: Right lumpectomy [name]): Intermediate grade DCIS, 1.2 cm, clear margins, ER 95%, PR 40% Tis NX stage 0  Pathology counseling: I discussed the final pathology report of the patient provided  a copy of this report. I discussed the margins. We also discussed the final staging  along with previously performed ER/PR testing.  COMET clinical trial: Randomized to surgery  Treatment plan: 1.  Adjuvant radiation (patient is contemplating on skipping radiation.  She has an appointment to discuss this with radiation oncology) I informed her that she can still remain on study even if she does not do radiation.  I strongly encouraged her to pursue radiation therapy. 2. Adjuvant antiestrogen therapy with tamoxifen x5 years  Tamoxifen counseling: We discussed the risks and benefits of tamoxifen. These include but not limited to insomnia, hot flashes, mood changes, vaginal dryness, and weight gain. Although rare, serious side effects  including endometrial cancer, risk of blood clots were also discussed. We strongly believe that the benefits far outweigh the risks. Patient understands these risks and consented to starting treatment. Planned treatment duration is 5 years.  I sent a prescription for tamoxifen today.  She will start at 10 mg daily for 2 weeks and then increase it to 20 mg daily.   No orders of the defined types were placed in this encounter.  The patient has a good understanding of the overall plan. she agrees with it. she will call with any problems that may develop before the next visit here.  Total time spent: 30 mins including face to face time and time spent for planning, charting and coordination of care  Nicholas Lose, MD 12/30/2020  I, Cloyde Reams Dorshimer, am acting as scribe for Dr. Nicholas Lose.  I have reviewed the above documentation for accuracy and completeness, and I agree with the above.

## 2020-12-30 ENCOUNTER — Other Ambulatory Visit: Payer: Self-pay

## 2020-12-30 ENCOUNTER — Inpatient Hospital Stay
Payer: No Typology Code available for payment source | Attending: Hematology and Oncology | Admitting: Hematology and Oncology

## 2020-12-30 DIAGNOSIS — Z79810 Long term (current) use of selective estrogen receptor modulators (SERMs): Secondary | ICD-10-CM | POA: Diagnosis not present

## 2020-12-30 DIAGNOSIS — D0511 Intraductal carcinoma in situ of right breast: Secondary | ICD-10-CM | POA: Diagnosis not present

## 2020-12-30 DIAGNOSIS — Z923 Personal history of irradiation: Secondary | ICD-10-CM | POA: Diagnosis not present

## 2020-12-30 MED ORDER — TAMOXIFEN CITRATE 20 MG PO TABS: 20.0000 mg | ORAL_TABLET | Freq: Every day | ORAL | 3 refills | Status: DC

## 2020-12-30 NOTE — Assessment & Plan Note (Signed)
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 I discussed the margins. We also discussed the final staging along with previously performed ER/PR testing.  COMET clinical trial: Randomized to surgery  Treatment plan: 1.  Adjuvant radiation followed by 2. Adjuvant antiestrogen therapy with tamoxifen x5 years  Return to clinic after radiation to start antiestrogen therapy.

## 2021-01-02 ENCOUNTER — Telehealth: Payer: Self-pay | Admitting: Hematology and Oncology

## 2021-01-02 NOTE — Telephone Encounter (Signed)
Rescheduled follow-up appointment per 1/28 schedule message due to appointment falling outside research window. Patient is aware of changes.

## 2021-01-06 NOTE — Progress Notes (Addendum)
Location of Breast Cancer: Ductal carcinoma in situ (DCIS) of RIGHT breast   Histology per Pathology Report: 12/22/2020 FINAL MICROSCOPIC DIAGNOSIS:  A. BREAST, RIGHT, LUMPECTOMY:  - Ductal carcinoma in situ, intermediate nuclear grade.  - DCIS is broadly < 1 mm from the posterior margin and focally 2 mm from  the medial margin.  - Biopsy site.  - See oncology table.  B. BREAST, RIGHT, ADDITIONAL POSTERIOR MARGIN, EXCISION:  - Breast tissue; negative for carcinoma.  C. BREAST, RIGHT, ADDITIONAL SUPERIOR MARGIN, EXCISION:  - Complex sclerosing lesion with atypical lobular hyperplasia; negative  for carcinoma.  D. BREAST, RIGHT, ADDITIONAL LATERAL MARGIN, EXCISION:  - Breast tissue; negative for carcinoma.  E. BREAST, RIGHT, ADDITIONAL ANTERIOR MARGIN, EXCISION:  - Residual ductal carcinoma in situ.  - Atypical lobular hyperplasia.  Receptor Status: ER(95%), PR (40%)  Did patient present with symptoms (if so, please note symptoms) or was this found on screening mammography?:  Screening mammogram on 09/13/20 showed right breast calcifications. Diagnostic mammogram on 09/26/20 showed right breast calcifications spanning 2.0cm  Past/Anticipated interventions by surgeon, if any: 12/22/2020 Dr. Rolm Bookbinder Right breast radioactive seed guided lumpectomy  Past/Anticipated interventions by medical oncology, if any:  Under care of Dr. Nicholas Lose 12/30/2020 Treatment plan: 1.  Adjuvant radiation followed by 2. Adjuvant antiestrogen therapy with tamoxifen x5 years  (a) I sent a prescription for tamoxifen today.  She will start at 10 mg daily for 2 weeks and then increase it to 20 mg  daily --Return to clinic after radiation to start antiestrogen therapy.  Lymphedema issues, if any:  Patient denies    Pain issues, if any:  Patient denies  SAFETY ISSUES:  Prior radiation? No  Pacemaker/ICD? No  Possible current pregnancy? No--postmenopausal    Is the patient on  methotrexate? No  Current Complaints / other details:   Reports she is scheduled for a follow-up with her surgeon Dr. [name] tomorrow 01/11/21. Patient has received both Pfizer vaccines but has not yet received booster

## 2021-01-10 ENCOUNTER — Ambulatory Visit
Admission: RE | Admit: 2021-01-10 | Discharge: 2021-01-10 | Disposition: A | Discharge status: Home / Self Care | Payer: No Typology Code available for payment source | Source: Ambulatory Visit | Attending: Radiation Oncology | Admitting: Radiation Oncology

## 2021-01-10 ENCOUNTER — Encounter: Payer: Self-pay | Admitting: Radiation Oncology

## 2021-01-10 ENCOUNTER — Other Ambulatory Visit: Payer: Self-pay

## 2021-01-10 VITALS — BP 145/90 | HR 68 | Temp 98.1°F | Resp 18 | Ht 66.0 in | Wt 185.6 lb

## 2021-01-10 DIAGNOSIS — Z923 Personal history of irradiation: Secondary | ICD-10-CM | POA: Diagnosis not present

## 2021-01-10 DIAGNOSIS — Z79810 Long term (current) use of selective estrogen receptor modulators (SERMs): Secondary | ICD-10-CM | POA: Insufficient documentation

## 2021-01-10 DIAGNOSIS — Z8616 Personal history of COVID-19: Secondary | ICD-10-CM | POA: Insufficient documentation

## 2021-01-10 DIAGNOSIS — D0511 Intraductal carcinoma in situ of right breast: Secondary | ICD-10-CM | POA: Insufficient documentation

## 2021-01-10 DIAGNOSIS — Z170 Estrogen receptor positive status [ER+]: Secondary | ICD-10-CM | POA: Insufficient documentation

## 2021-01-10 NOTE — Progress Notes (Signed)
Radiation Oncology         [phone] ________________________________  Initial Outpatient Consultation  Name: Cindy Roman MRN: [id number]  Date: 01/10/2021  DOB: Dec 16, 1955  OZ:DGUYQ, Hal Hope, MD  Nicholas Lose, MD   REFERRING PHYSICIAN: Nicholas Lose, MD  DIAGNOSIS:    ICD-10-CM   1. Ductal carcinoma in situ (DCIS) of right breast  D05.11     Cancer Staging Ductal carcinoma in situ (DCIS) of right breast Staging form: Breast, AJCC 8th Edition - Clinical stage from 10/12/2020: Stage 0 (cTis (DCIS), cN0, cM0, ER+, PR+) - Signed by Gardenia Phlegm, NP on 10/12/2020 Stage prefix: Initial diagnosis   CHIEF COMPLAINT: Here to discuss management of right breast DCIS  HISTORY OF PRESENT ILLNESS::Cindy Roman is a 65 y.o. female who presented with right breast abnormality on the following imaging: bilateral screening mammogram on the date of 09/13/2020. No symptoms were reported at that time. Diagnostic right mammogram on 09/26/2020 revealed indeterminate right breast calcifications. Biopsy on the date of 10/03/2020 showed ductal carcinoma in-situ and lobular carcinoma in-situ. ER status: 95% strong; PR status: 40% strong; Grade: 1-2.  Breast surgery on the date of 12/22/2020 revealed tumor size of 1.2 cm. Histology of ductal carcinoma in-situ. Marginal status to ductal carcinoma was broadly <1 mm from the posterior margin and focally 2 mm from the medial margin. Additional right superior margin was excised and showed complex sclerosing lesion with atypical lobular hyperplasia; negative for carcinoma. Right additional anterior margin showed residual DCIS and atypical lobular hyperplasia. Additional right lateral and posterior margins were negative for carcinoma. ER status: 90% strong; PR status: 40% strong; Grade: 2.  She is currently enrolled on the COMET trial and anticipates taking tamoxifen for several years (recently received prescription from Dr. Lindi Adie).  She works in  Personal assistant.     She denies any first-degree relatives with a history of breast cancer   PREVIOUS RADIATION THERAPY: No  PAST MEDICAL HISTORY:  has a past medical history of COVID (12/05/2020).    PAST SURGICAL HISTORY: Past Surgical History:  Procedure Laterality Date  . ANKLE SURGERY    . BREAST BIOPSY Left 2003  . BREAST LUMPECTOMY WITH RADIOACTIVE SEED LOCALIZATION Right 12/22/2020   Procedure: RIGHT BREAST LUMPECTOMY WITH RADIOACTIVE SEED LOCALIZATION;  Surgeon: Rolm Bookbinder, MD;  Location: [facility];  Service: General;  Laterality: Right;  . CHOLECYSTECTOMY    . WRIST SURGERY      FAMILY HISTORY: family history is not on file.  She denies any first-degree relatives with a history of breast cancer  SOCIAL HISTORY:  reports that she has never smoked. She has never used smokeless tobacco. She reports current alcohol use. She reports that she does not use drugs.  ALLERGIES: Penicillins  MEDICATIONS:  Current Outpatient Medications  Medication Sig Dispense Refill  . Ascorbic Acid (VITAMIN C PO) Take by mouth.    . Misc Natural Products (DAILY HERBS IMMUNE DEFENSE PO) Take by mouth.    . Multiple Vitamins-Minerals (PRESERVISION AREDS PO) Take by mouth.    . Nutritional Supplements (JUICE PLUS FIBRE PO) Take by mouth.    . tamoxifen (NOLVADEX) 20 MG tablet Take 1 tablet (20 mg total) by mouth daily. 90 tablet 3   No current facility-administered medications for this encounter.    REVIEW OF SYSTEMS: As above  PHYSICAL EXAM:  height is $RemoveB'5\' 6"'cTGBkxDM$  (1.676 m) and weight is 185 lb 9.6 oz (84.2 kg). Her temperature is 98.1 F (36.7 C). Her blood  pressure is 145/90 (abnormal) and her pulse is 68. Her respiration is 18 and oxygen saturation is 100%.   General: Alert and oriented, in no acute distress   Breast exam deferred today  LABORATORY DATA:  Lab Results  Component Value Date   WBC 5.7 05/03/2009   HGB 12.9 05/03/2009   HCT 38.5 05/03/2009   MCV 93.4  05/03/2009   PLT 174 05/03/2009   CMP     Component Value Date/Time   NA 138 05/03/2009 0850   K 4.8 05/03/2009 0850   CL 105 05/03/2009 0850   CO2 27 05/03/2009 0850   GLUCOSE 130 (H) 05/03/2009 0850   BUN 11 05/03/2009 0850   CREATININE 0.81 05/03/2009 0850   CALCIUM 9.4 05/03/2009 0850   PROT 6.7 05/03/2009 0850   ALBUMIN 3.8 05/03/2009 0850   AST 22 05/03/2009 0850   ALT 25 05/03/2009 0850   ALKPHOS 69 05/03/2009 0850   BILITOT 0.7 05/03/2009 0850   GFRNONAA >60 05/03/2009 0850   GFRAA  05/03/2009 0850    >60        The eGFR has been calculated using the MDRD equation. This calculation has not been validated in all clinical situations. eGFR's persistently <60 mL/min signify possible Chronic Kidney Disease.         RADIOGRAPHY: MM Breast Surgical Specimen  Result Date: 12/22/2020 CLINICAL DATA:  Specimen radiograph status post right breast lumpectomy. EXAM: SPECIMEN RADIOGRAPH OF THE RIGHT BREAST COMPARISON:  Previous exam(s). FINDINGS: Status post excision of the right breast. The radioactive seed and biopsy marker clip are are present and completely intact. These findings were communicated to the OR at 12:28 p.m. IMPRESSION: Specimen radiograph of the right breast. Electronically Signed   By: Frederico Hamman M.D.   On: 12/22/2020 12:28   MM RT RADIOACTIVE SEED LOC MAMMO GUIDE  Addendum Date: 12/22/2020   ADDENDUM REPORT: 12/21/2020 15:52 ADDENDUM: These results were called to Dr. Dwain Sarna nurse, Nettie Elm, at the time of dictation. Electronically Signed   By: Elberta Fortis M.D.   On: 12/21/2020 15:52   Addendum Date: 12/21/2020   ADDENDUM REPORT: 12/21/2020 15:52 ADDENDUM: These results were called to Dr. Dwain Sarna nurse, Nettie Elm, at the time of dictation. Electronically Signed   By: Elberta Fortis M.D.   On: 12/21/2020 15:52   Result Date: 12/21/2020 CLINICAL DATA:  Patient presents for radioactive seed localization prior to surgical excision of biopsy-proven DCIS  posterior third outer midportion right breast marked by coil clip. Note that the post biopsy clip films demonstrate the coil clip to be located approximately 1.5-2 cm inferior to the biopsied microcalcifications. The residual calcifications above the clip will be targeted for localization. EXAM: MAMMOGRAPHIC GUIDED RADIOACTIVE SEED LOCALIZATION OF THE RIGHT BREAST COMPARISON:  Previous exam(s). FINDINGS: Patient presents for radioactive seed localization prior to surgical excision. I met with the patient and we discussed the procedure of seed localization including benefits and alternatives. We discussed the high likelihood of a successful procedure. We discussed the risks of the procedure including infection, bleeding, tissue injury and further surgery. We discussed the low dose of radioactivity involved in the procedure. Informed, written consent was given. The usual time-out protocol was performed immediately prior to the procedure. Using mammographic guidance, sterile technique, 1% lidocaine and a 7 cm needle, the targeted coil clip/residual malignant microcalcifications were localized using a lateral to medial approach. Note that on the lateral image, the center of the coil clip lies 2 cm inferior to the center of the radioactive  seed as the seed is located directly at the residual microcalcifications. The seed lies 4 mm posterolateral to the clip on the CC image and is directly at the residual microcalcifications. The follow-up mammogram images confirm the seed in the expected location and were marked for Dr. [name]. Follow-up survey of the patient confirms presence of the radioactive seed. Order number of I-125 seed:  [id number]. Total activity:  8.088 millicurie reference Date: 12/06/2020 The patient tolerated the procedure well and was released from the [facility]. She was given instructions regarding seed removal. IMPRESSION: Radioactive seed localization of the right breast as described. Note that  the seed lies directly at the residual malignant microcalcifications and is located 2 cm above the coil clip on the lateral image. Electronically Signed: By: Marin Olp M.D. On: 12/21/2020 12:28      IMPRESSION/PLAN: Right breast DCIS status post lumpectomy  She is enrolled on the COMET trial.  She has plans to take tamoxifen for several years.  She understands from her discussion with medical oncology that her risk of recurrence with tamoxifen as her only postoperative therapy would be about 6% in the ipsilateral breast.  She understands that adding radiation therapy to the right breast would reduce the risk further to about 2%.  I confirmed the statistics with her using the [facility].  She is interested in learning about all of her treatment options.  It was a pleasure meeting the patient today. We discussed the risks, benefits, and side effects of radiotherapy.   We discussed that radiation would take approximately 3-4 weeks to complete. We spoke about acute effects including skin irritation and fatigue. We spoke about the latest technology that is used to minimize the risk of late effects for patients undergoing radiotherapy to the breast or chest wall. No guarantees of treatment were given. The patient would like to think about her options before she makes a decision.  A consent form was not signed today.  She was given our contact information in case she decides to pursue radiation therapy.  She understands that radiation therapy would not improve her life expectancy and that her prognosis is excellent regardless of whether she pursues radiation.  The main benefit is a small additional reduction in the risk of recurrence in the breast.  I wished her the best.  Tomorrow she will see Dr. [name] and talk to him further.  On date of service, in total, I spent 30 minutes on this encounter. Patient was seen in person.   __________________________________________   Eppie Gibson, MD  This document serves as a record of services personally performed by Eppie Gibson, MD. It was created on his behalf by Clerance Lav, a trained medical scribe. The creation of this record is based on the scribe's personal observations and the provider's statements to them. This document has been checked and approved by the attending provider.

## 2021-01-13 ENCOUNTER — Encounter: Payer: Self-pay | Admitting: *Deleted

## 2021-05-04 NOTE — Progress Notes (Signed)
Patient Care Team: Molli Posey, MD as PCP - General (Obstetrics and Gynecology) Mauro Kaufmann, RN as Oncology Nurse Navigator Rockwell Germany, RN as Oncology Nurse Navigator  DIAGNOSIS:    ICD-10-CM   1. Ductal carcinoma in situ (DCIS) of right breast  D05.11     SUMMARY OF ONCOLOGIC HISTORY: Oncology History  Ductal carcinoma in situ (DCIS) of right breast  10/12/2020 Initial Diagnosis   Screening mammogram showed right breast calcifications, spanning 2.0cm. 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    10/12/2020 Cancer Staging   Staging form: Breast, AJCC 8th Edition - Clinical stage from 10/12/2020: Stage 0 (cTis (DCIS), cN0, cM0, ER+, PR+) - Signed by Gardenia Phlegm, NP on 10/12/2020   12/22/2020 Surgery   Right lumpectomy [name]): intermediate grade DCIS, 1.2cm, clear margins (COMET clinical trial)     CHIEF COMPLIANT: Follow-up of right breast DCIS on tamoxifen  INTERVAL HISTORY: Cindy Roman is a 65 y.o. with above-mentioned history of right breast DCIS.She is a participant in the COMET clinical trial and was randomized to the surgery arm. She underwent a right lumpectomy and is currently on antiestrogen therapy with tamoxifen. She presents to the clinic today for follow-up.  She is tolerating tamoxifen extremely well without any problems or concerns.  She has occasional twinges of discomfort in the breast but no severe pain or lumps or nodules.  ALLERGIES:  is allergic to penicillins.  MEDICATIONS:  Current Outpatient Medications  Medication Sig Dispense Refill  . Ascorbic Acid (VITAMIN C PO) Take by mouth.    . Misc Natural Products (DAILY HERBS IMMUNE DEFENSE PO) Take by mouth.    . Multiple Vitamins-Minerals (PRESERVISION AREDS PO) Take by mouth.    . Nutritional Supplements (JUICE PLUS FIBRE PO) Take by mouth.    . tamoxifen (NOLVADEX) 20 MG tablet Take 1 tablet (20 mg total) by mouth  daily. 90 tablet 3   No current facility-administered medications for this visit.    PHYSICAL EXAMINATION: ECOG PERFORMANCE STATUS: 1 - Symptomatic but completely ambulatory  Vitals:   05/05/21 0913  BP: 130/71  Pulse: 78  Resp: 18  Temp: 97.6 F (36.4 C)  SpO2: 95%   Filed Weights   05/05/21 0913  Weight: 184 lb 6.4 oz (83.6 kg)      LABORATORY DATA:  I have reviewed the data as listed CMP Latest Ref Rng & Units 05/03/2009  Glucose 70 - 99 mg/dL 130(H)  BUN 6 - 23 mg/dL 11  Creatinine 0.4 - 1.2 mg/dL 0.81  Sodium 135 - 145 mEq/L 138  Potassium 3.5 - 5.1 mEq/L 4.8  Chloride 96 - 112 mEq/L 105  CO2 19 - 32 mEq/L 27  Calcium 8.4 - 10.5 mg/dL 9.4  Total Protein 6.0 - 8.3 g/dL 6.7  Total Bilirubin 0.3 - 1.2 mg/dL 0.7  Alkaline Phos 39 - 117 U/L 69  AST 0 - 37 U/L 22  ALT 0 - 35 U/L 25    Lab Results  Component Value Date   WBC 5.7 05/03/2009   HGB 12.9 05/03/2009   HCT 38.5 05/03/2009   MCV 93.4 05/03/2009   PLT 174 05/03/2009   NEUTROABS 3.0 05/03/2009    ASSESSMENT & PLAN:  Ductal carcinoma in situ (DCIS) of right breast 12/22/2020: Right lumpectomy [name]): Intermediate grade DCIS, 1.2 cm, clear margins, ER 95%, PR 40% Tis NX stage 0  Pathology counseling: I discussed the final pathology report of  the patient provided  a copy of this report. I discussed the margins. We also discussed the final staging along with previously performed ER/PR testing.  COMET clinical trial: Randomized to surgery  Treatment plan: 1. Patient refused adjuvant radiation 2. Adjuvant antiestrogen therapy with tamoxifen x5 years  Tamoxifen toxicities: Denies any hot flashes. Very occasional cramps in the legs but they do not bother her. Denies any arthralgias or myalgias.  Breast cancer surveillance: Mammogram will need to be done November 2022.  Return to clinic every 6 months for follow-up    No orders of the defined types were placed in this encounter.  The  patient has a good understanding of the overall plan. she agrees with it. she will call with any problems that may develop before the next visit here.  Total time spent: 20 mins including face to face time and time spent for planning, charting and coordination of care  Rulon Eisenmenger, MD, MPH 05/05/2021  I, Cloyde Reams Dorshimer, am acting as scribe for Dr. Nicholas Lose.  I have reviewed the above documentation for accuracy and completeness, and I agree with the above.

## 2021-05-05 ENCOUNTER — Encounter: Payer: Self-pay | Admitting: Medical Oncology

## 2021-05-05 ENCOUNTER — Inpatient Hospital Stay
Payer: No Typology Code available for payment source | Attending: Hematology and Oncology | Admitting: Hematology and Oncology

## 2021-05-05 ENCOUNTER — Telehealth: Payer: Self-pay | Admitting: Hematology and Oncology

## 2021-05-05 ENCOUNTER — Other Ambulatory Visit: Payer: Self-pay

## 2021-05-05 DIAGNOSIS — Z170 Estrogen receptor positive status [ER+]: Secondary | ICD-10-CM | POA: Diagnosis not present

## 2021-05-05 DIAGNOSIS — D0511 Intraductal carcinoma in situ of right breast: Secondary | ICD-10-CM

## 2021-05-05 DIAGNOSIS — Z006 Encounter for examination for normal comparison and control in clinical research program: Secondary | ICD-10-CM

## 2021-05-05 NOTE — Telephone Encounter (Signed)
Scheduled appointment per 06/03 los. Patient is aware.

## 2021-05-05 NOTE — Progress Notes (Signed)
AFT - 25: COMPARING AN OPERATION TO MONITORING, WITH OR WITHOUT ENDOCRINE THERAPY (COMET) FOR LOW RISK DCIS: A PHASE III PROSPECTIVE RANDOMIZED TRIAL  6- month study visit.   Patient in clinic today for her 31-month study visit. Patient confirms that she is taking her tamoxifen as instructed. Solicited AE's were reviewed with patient, and documented below. Patient had lumpectomy December 22, 2020 and reports to be doing well today. All patient's questions and concerns were addressed by MD, with patient, in clinic today.  Mammogram:Not required at this study visit. Yearly bilateral mammogram to be done toward the end of 3837 Solicited and Other/Adverse Events: Reviewed with patient and documented below. Patient with no hospital admissions and no SAE's. Questionnaires: Patient was reminded to complete the 6 month questionnaires and she confirmed that she has received it in her email and confirms she will complete.  Plan: Mrs. Hampshire will be scheduled to return in approximately 6 months for MD visit, after bilateral mammogram. I thanked patient for her continued participation and support of study and encouraged her to contact Dr. Lindi Adie or research with any questions or concerns she may have. Maxwell Marion, RN, BSN, [facility] Clinical Research 05/05/2021 11:26 AM  DESIRA ALESSANDRINI [id number]  05/05/2021  Adverse Event Log  Study/Protocol: COMET Cycle: baseline - 6 months  Event Grade Drug Name Attribution Treatment Comments  Hot flashes  1 Tomaxifen Probable none occasional   Hypertension 1 N/A N/A Present at Baseline   Pain- right hip 2 N/A N/A Baseline

## 2021-05-05 NOTE — Assessment & Plan Note (Signed)
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 I discussed the margins. We also discussed the final staging along with previously performed ER/PR testing.  COMET clinical trial: Randomized to surgery  Treatment plan: 1. Patient refused adjuvant radiation 2. Adjuvant antiestrogen therapy with tamoxifen x5 years  Tamoxifen toxicities:  Breast cancer surveillance: 1.  Breast exam 05/05/2021: Benign 2. Mammogram will need to be done October 2022.  Return to clinic every 6 months for follow-up

## 2021-05-08 ENCOUNTER — Other Ambulatory Visit: Payer: Self-pay | Admitting: *Deleted

## 2021-05-08 DIAGNOSIS — D0511 Intraductal carcinoma in situ of right breast: Secondary | ICD-10-CM

## 2021-05-09 ENCOUNTER — Telehealth: Payer: Self-pay | Admitting: Hematology and Oncology

## 2021-05-09 NOTE — Telephone Encounter (Signed)
Scheduled appointment per 06/06 sch msg. Patient is aware. 

## 2021-06-08 ENCOUNTER — Ambulatory Visit: Payer: No Typology Code available for payment source

## 2021-06-08 ENCOUNTER — Ambulatory Visit (INDEPENDENT_AMBULATORY_CARE_PROVIDER_SITE_OTHER): Payer: No Typology Code available for payment source | Admitting: Podiatry

## 2021-06-08 ENCOUNTER — Other Ambulatory Visit: Payer: Self-pay

## 2021-06-08 DIAGNOSIS — S92501A Displaced unspecified fracture of right lesser toe(s), initial encounter for closed fracture: Secondary | ICD-10-CM

## 2021-06-08 DIAGNOSIS — A6000 Herpesviral infection of urogenital system, unspecified: Secondary | ICD-10-CM | POA: Insufficient documentation

## 2021-06-08 DIAGNOSIS — S9030XA Contusion of unspecified foot, initial encounter: Secondary | ICD-10-CM

## 2021-06-08 DIAGNOSIS — M7750 Other enthesopathy of unspecified foot: Secondary | ICD-10-CM

## 2021-06-08 MED ORDER — MELOXICAM 15 MG PO TABS: 15.0000 mg | ORAL_TABLET | Freq: Every day | ORAL | 0 refills | Status: AC

## 2021-06-08 NOTE — Patient Instructions (Signed)
Plantar Fasciitis (Heel Spur Syndrome) with Rehab The plantar fascia is a fibrous, ligament-like, soft-tissue structure that spans the bottom of the foot. Plantar fasciitis is a condition that causes pain in the foot due to inflammation of the tissue. SYMPTOMS   Pain and tenderness on the underneath side of the foot.  Pain that worsens with standing or walking. CAUSES  Plantar fasciitis is caused by irritation and injury to the plantar fascia on the underneath side of the foot. Common mechanisms of injury include:  Direct trauma to bottom of the foot.  Damage to a small nerve that runs under the foot where the main fascia attaches to the heel bone.  Stress placed on the plantar fascia due to bone spurs. RISK INCREASES WITH:   Activities that place stress on the plantar fascia (running, jumping, pivoting, or cutting).  Poor strength and flexibility.  Improperly fitted shoes.  Tight calf muscles.  Flat feet.  Failure to warm-up properly before activity.  Obesity. PREVENTION  Warm up and stretch properly before activity.  Allow for adequate recovery between workouts.  Maintain physical fitness:  Strength, flexibility, and endurance.  Cardiovascular fitness.  Maintain a health body weight.  Avoid stress on the plantar fascia.  Wear properly fitted shoes, including arch supports for individuals who have flat feet.  PROGNOSIS  If treated properly, then the symptoms of plantar fasciitis usually resolve without surgery. However, occasionally surgery is necessary.  RELATED COMPLICATIONS   Recurrent symptoms that may result in a chronic condition.  Problems of the lower back that are caused by compensating for the injury, such as limping.  Pain or weakness of the foot during push-off following surgery.  Chronic inflammation, scarring, and partial or complete fascia tear, occurring more often from repeated injections.  TREATMENT  Treatment initially involves the  use of ice and medication to help reduce pain and inflammation. The use of strengthening and stretching exercises may help reduce pain with activity, especially stretches of the Achilles tendon. These exercises may be performed at home or with a therapist. Your caregiver may recommend that you use heel cups of arch supports to help reduce stress on the plantar fascia. Occasionally, corticosteroid injections are given to reduce inflammation. If symptoms persist for greater than 6 months despite non-surgical (conservative), then surgery may be recommended.   MEDICATION   If pain medication is necessary, then nonsteroidal anti-inflammatory medications, such as aspirin and ibuprofen, or other minor pain relievers, such as acetaminophen, are often recommended.  Do not take pain medication within 7 days before surgery.  Prescription pain relievers may be given if deemed necessary by your caregiver. Use only as directed and only as much as you need.  Corticosteroid injections may be given by your caregiver. These injections should be reserved for the most serious cases, because they may only be given a certain number of times.  HEAT AND COLD  Cold treatment (icing) relieves pain and reduces inflammation. Cold treatment should be applied for 10 to 15 minutes every 2 to 3 hours for inflammation and pain and immediately after any activity that aggravates your symptoms. Use ice packs or massage the area with a piece of ice (ice massage).  Heat treatment may be used prior to performing the stretching and strengthening activities prescribed by your caregiver, physical therapist, or athletic trainer. Use a heat pack or soak the injury in warm water.  SEEK IMMEDIATE MEDICAL CARE IF:  Treatment seems to offer no benefit, or the condition worsens.  Any medications   produce adverse side effects.  EXERCISES- RANGE OF MOTION (ROM) AND STRETCHING EXERCISES - Plantar Fasciitis (Heel Spur Syndrome) These exercises  may help you when beginning to rehabilitate your injury. Your symptoms may resolve with or without further involvement from your physician, physical therapist or athletic trainer. While completing these exercises, remember:   Restoring tissue flexibility helps normal motion to return to the joints. This allows healthier, less painful movement and activity.  An effective stretch should be held for at least 30 seconds.  A stretch should never be painful. You should only feel a gentle lengthening or release in the stretched tissue.  RANGE OF MOTION - Toe Extension, Flexion  Sit with your right / left leg crossed over your opposite knee.  Grasp your toes and gently pull them back toward the top of your foot. You should feel a stretch on the bottom of your toes and/or foot.  Hold this stretch for 10 seconds.  Now, gently pull your toes toward the bottom of your foot. You should feel a stretch on the top of your toes and or foot.  Hold this stretch for 10 seconds. Repeat  times. Complete this stretch 3 times per day.   RANGE OF MOTION - Ankle Dorsiflexion, Active Assisted  Remove shoes and sit on a chair that is preferably not on a carpeted surface.  Place right / left foot under knee. Extend your opposite leg for support.  Keeping your heel down, slide your right / left foot back toward the chair until you feel a stretch at your ankle or calf. If you do not feel a stretch, slide your bottom forward to the edge of the chair, while still keeping your heel down.  Hold this stretch for 10 seconds. Repeat 3 times. Complete this stretch 2 times per day.   STRETCH  Gastroc, Standing  Place hands on wall.  Extend right / left leg, keeping the front knee somewhat bent.  Slightly point your toes inward on your back foot.  Keeping your right / left heel on the floor and your knee straight, shift your weight toward the wall, not allowing your back to arch.  You should feel a gentle stretch  in the right / left calf. Hold this position for 10 seconds. Repeat 3 times. Complete this stretch 2 times per day.  STRETCH  Soleus, Standing  Place hands on wall.  Extend right / left leg, keeping the other knee somewhat bent.  Slightly point your toes inward on your back foot.  Keep your right / left heel on the floor, bend your back knee, and slightly shift your weight over the back leg so that you feel a gentle stretch deep in your back calf.  Hold this position for 10 seconds. Repeat 3 times. Complete this stretch 2 times per day.  STRETCH  Gastrocsoleus, Standing  Note: This exercise can place a lot of stress on your foot and ankle. Please complete this exercise only if specifically instructed by your caregiver.   Place the ball of your right / left foot on a step, keeping your other foot firmly on the same step.  Hold on to the wall or a rail for balance.  Slowly lift your other foot, allowing your body weight to press your heel down over the edge of the step.  You should feel a stretch in your right / left calf.  Hold this position for 10 seconds.  Repeat this exercise with a slight bend in your right /   gain both the endurance and the strength needed for everyday activities through controlled exercises. Complete these exercises as instructed by your physician, physical therapist or athletic trainer. Progress the resistance and repetitions only as guided.  STRENGTH - Towel Curls Sit in a chair positioned on a non-carpeted surface. Place your foot on a towel, keeping your heel on the floor. Pull the towel toward your heel by only curling your toes. Keep your heel on the floor. Repeat 3 times.  Complete this exercise 2 times per day.  STRENGTH - Ankle Inversion Secure one end of a rubber exercise band/tubing to a fixed object (table, pole). Loop the other end around your foot just before your toes. Place your fists between your knees. This will focus your strengthening at your ankle. Slowly, pull your big toe up and in, making sure the band/tubing is positioned to resist the entire motion. Hold this position for 10 seconds. Have your muscles resist the band/tubing as it slowly pulls your foot back to the starting position. Repeat 3 times. Complete this exercises 2 times per day.  Document Released: 11/19/2005 Document Revised: 02/11/2012 Document Reviewed: 03/03/2009 [facility] Patient Information 2014 [place], [place]. Toe Fracture A toe fracture is a break in one of the toe bones (phalanges). This may happen if you: Drop a heavy object on your toe. Stub your toe. Twist your toe. Exercise the same way too much. What are the signs or symptoms? The main symptoms are swelling and pain in the toe. You may also have: Bruising. Stiffness. Numbness. A change in the way the toe looks. Broken bones that poke through the skin. Blood under the toenail. How is this treated? Treatments may include: Taping the broken toe to a toe that is next to it (buddy taping). Wearing a shoe that has a wide, rigid sole to protect the toe and to limit its movement. Wearing a cast. Surgery. This may be needed if the: Pieces of broken bone are out of place. Bone pokes through the skin. Physical therapy. Follow these instructions at home: If you have a shoe: Wear the shoe as told by your doctor. Remove it only as told by your doctor. Loosen the shoe if your toes tingle, become numb, or turn cold and blue. Keep the shoe clean and dry. If you have a cast: Do not put pressure on any part of the cast until it is fully hardened. This may take a few hours. Do not stick anything inside the cast to scratch  your skin. Check the skin around the cast every day. Tell your doctor about any concerns. You may put lotion on dry skin around the edges of the cast. Do not put lotion on the skin under the cast. Keep the cast clean and dry. Bathing Do not take baths, swim, or use a hot tub until your doctor says it is okay. Ask your doctor if you can take showers. If the shoe or cast is not waterproof: Do not let it get wet. Cover it with a watertight covering when you take a bath or a shower. Activity Do not use your foot to support your body weight until your doctor says it is okay. Use crutches as told by your doctor. Ask your doctor what activities are safe for you during recovery. Avoid activities as told by your doctor. Do exercises as told by your doctor or therapist. Driving Do not drive or use heavy machinery while taking pain medicine. Do not drive while wearing a cast on a  foot that you use for driving. Managing pain, stiffness, and swelling  Put ice on the injured area if told by your doctor: Put ice in a plastic bag. Place a towel between your skin and the bag. If you have a shoe, remove it as told by your doctor. If you have a cast, place a towel between your cast and the bag. Leave the ice on for 20 minutes, 2-3 times per day. Raise (elevate) the injured area above the level of your heart while you are sitting or lying down.  General instructions If your toe was taped to a toe that is next to it, follow your doctor's instructions for changing the gauze and tape. Change it more often: If the gauze and tape get wet. If this happens, dry the space between the toes. If the gauze and tape are too tight and they cause your toe to become pale or to lose feeling (go numb). If your doctor did not give you a protective shoe, wear sturdy shoes that support your foot. Your shoes should not: Pinch your toes. Fit tightly against your toes. Do not use any tobacco products, including cigarettes,  chewing tobacco, or e-cigarettes. These can delay bone healing. If you need help quitting, ask your doctor. Take medicines only as told by your doctor. Keep all follow-up visits as told by your doctor. This is important. Contact a doctor if: Your pain medicine is not helping. You have a fever. You notice a bad smell coming from your cast. Get help right away if: You lose feeling (have numbness) in your toe or foot, and it is getting worse. Your toe or your foot tingles. Your toe or your foot gets cold or turns blue. You have redness or swelling in your toe or foot, and it is getting worse. You have very bad pain. Summary A toe fracture is a break in one of the toe bones. Use ice and raise your foot. This will help lessen pain and swelling. Use crutches as told by your doctor. This information is not intended to replace advice given to you by your health care provider. Make sure you discuss any questions you have with your healthcare provider. Document Revised: 01/23/2018 Document Reviewed: 12/31/2017 [employer] Patient Education  2022 [employer].

## 2021-06-13 NOTE — Progress Notes (Signed)
Subjective: 65 year old female presents the office today for concerns of right fourth toe injury.  She states that she got her toe caught and says that she has had some pain and swelling to the foot.  She said no recent treatment for this.  Also she has been doing some discomfort of the lateral aspect of the left foot and its constant sharp pain which is on the last couple weeks.  No injury to the left side.  No recent treatment.  No other concerns. Denies any systemic complaints such as fevers, chills, nausea, vomiting. No acute changes since last appointment.   Objective: AAO x3, NAD DP/PT pulses palpable bilaterally, CRT less than 3 seconds There is tenderness palpation along the right fourth digit.  On discomfort metatarsal but no other areas of pinpoint tenderness but there is edema present to the foot there is no erythema or warmth associated with this on the right side.  On the left side there is mild discomfort along the plantar lateral aspect of the heel on plantar fascial.  There is no area of pinpoint tenderness.  No pain with metatarsal calcaneus.  No pain in ankle.  MMT 5/5. No pain with calf compression, swelling, warmth, erythema  Assessment: Right fourth toe fracture, swelling; tendinitis left side  Plan: -All treatment options discussed with the patient including all alternatives, risks, complications.  -X-rays obtained and reviewed.  There is mild displaced fracture noted of the fourth digit on the right foot.  No evidence of acute fracture.  Digital deformities noted. -On the right foot recommend immobilization surgical shoe which was dispensed today.  Ice elevation.  Meloxicam as needed. -I discussed stretching, icing daily.  Also meloxicam as needed.  Discussed supportive shoe gear. -Patient encouraged to call the office with any questions, concerns, change in symptoms.   Trula Slade DPM

## 2021-06-29 ENCOUNTER — Ambulatory Visit: Payer: No Typology Code available for payment source | Admitting: Hematology and Oncology

## 2021-07-06 ENCOUNTER — Ambulatory Visit (INDEPENDENT_AMBULATORY_CARE_PROVIDER_SITE_OTHER): Payer: No Typology Code available for payment source | Admitting: Podiatry

## 2021-07-06 ENCOUNTER — Other Ambulatory Visit: Payer: Self-pay

## 2021-07-06 ENCOUNTER — Ambulatory Visit (INDEPENDENT_AMBULATORY_CARE_PROVIDER_SITE_OTHER): Payer: No Typology Code available for payment source

## 2021-07-06 DIAGNOSIS — M722 Plantar fascial fibromatosis: Secondary | ICD-10-CM | POA: Diagnosis not present

## 2021-07-06 DIAGNOSIS — S92501D Displaced unspecified fracture of right lesser toe(s), subsequent encounter for fracture with routine healing: Secondary | ICD-10-CM

## 2021-07-06 DIAGNOSIS — S92501A Displaced unspecified fracture of right lesser toe(s), initial encounter for closed fracture: Secondary | ICD-10-CM

## 2021-07-06 DIAGNOSIS — S92501S Displaced unspecified fracture of right lesser toe(s), sequela: Secondary | ICD-10-CM

## 2021-07-06 DIAGNOSIS — M7750 Other enthesopathy of unspecified foot: Secondary | ICD-10-CM | POA: Diagnosis not present

## 2021-07-06 NOTE — Progress Notes (Signed)
Subjective: 65 year old female presents the office today for follow-up evaluation of fracture to the right fourth toe.  She states that is not swelling is much and is feeling better.  She still gets an occasional discomfort in the left ankle intermittently.  She states that she let landmarks and exercises.  No recent injury or changes otherwise. Denies any systemic complaints such as fevers, chills, nausea, vomiting. No acute changes since last appointment, and no other complaints at this time.   Objective: AAO x3, NAD DP/PT pulses palpable bilaterally, CRT less than 3 seconds There is no significant tenderness palpation of right.  There is also mild edema.  No erythema or warmth.  No skin breakdown.  No other areas of discomfort on the right side.   On the left side there is edema present in the course of the peroneal tendon as well as lateral ankle ligament complex but no significant area of tenderness identified.  There is no gross ankle instability present.  There is mild discomfort along the course of plantar fascial left side.  No area of pinpoint tenderness.  MMT 5/5 bilaterally.  No open lesions or pre-ulcerative lesions.  No pain with calf compression, swelling, warmth, erythema  Assessment: 65 year old female with right fourth toe fracture, left ankle, foot tendinitis/swelling  Plan: -All treatment options discussed with the patient including all alternatives, risks, complications.  -Repeat x-rays obtained reviewed of the right foot.  Oblique fracture of the proximal phalanx of the fourth digit with mild displacement there is some callus formation present. -Regards to the right foot discussed taping of the digit as well as stiffer soled shoe. -Regards to left side we discussed a night splint which was dispensed.  Gentle stretching exercises were discussed as well.  Ice daily. -Patient encouraged to call the office with any questions, concerns, change in symptoms.   Trula Slade  DPM

## 2021-07-06 NOTE — Patient Instructions (Addendum)
For instructions on how to put on your Night Splint, please visit [email or web address]   Plantar Fasciitis (Heel Spur Syndrome) with Rehab The plantar fascia is a fibrous, ligament-like, soft-tissue structure that spans the bottom of the foot. Plantar fasciitis is a condition that causes pain in the foot due to inflammation of the tissue. SYMPTOMS   Pain and tenderness on the underneath side of the foot.  Pain that worsens with standing or walking. CAUSES  Plantar fasciitis is caused by irritation and injury to the plantar fascia on the underneath side of the foot. Common mechanisms of injury include:  Direct trauma to bottom of the foot.  Damage to a small nerve that runs under the foot where the main fascia attaches to the heel bone.  Stress placed on the plantar fascia due to bone spurs. RISK INCREASES WITH:   Activities that place stress on the plantar fascia (running, jumping, pivoting, or cutting).  Poor strength and flexibility.  Improperly fitted shoes.  Tight calf muscles.  Flat feet.  Failure to warm-up properly before activity.  Obesity. PREVENTION  Warm up and stretch properly before activity.  Allow for adequate recovery between workouts.  Maintain physical fitness:  Strength, flexibility, and endurance.  Cardiovascular fitness.  Maintain a health body weight.  Avoid stress on the plantar fascia.  Wear properly fitted shoes, including arch supports for individuals who have flat feet.  PROGNOSIS  If treated properly, then the symptoms of plantar fasciitis usually resolve without surgery. However, occasionally surgery is necessary.  RELATED COMPLICATIONS   Recurrent symptoms that may result in a chronic condition.  Problems of the lower back that are caused by compensating for the injury, such as limping.  Pain or weakness of the foot during push-off following surgery.  Chronic inflammation, scarring, and partial or complete fascia tear,  occurring more often from repeated injections.  TREATMENT  Treatment initially involves the use of ice and medication to help reduce pain and inflammation. The use of strengthening and stretching exercises may help reduce pain with activity, especially stretches of the Achilles tendon. These exercises may be performed at home or with a therapist. Your caregiver may recommend that you use heel cups of arch supports to help reduce stress on the plantar fascia. Occasionally, corticosteroid injections are given to reduce inflammation. If symptoms persist for greater than 6 months despite non-surgical (conservative), then surgery may be recommended.   MEDICATION   If pain medication is necessary, then nonsteroidal anti-inflammatory medications, such as aspirin and ibuprofen, or other minor pain relievers, such as acetaminophen, are often recommended.  Do not take pain medication within 7 days before surgery.  Prescription pain relievers may be given if deemed necessary by your caregiver. Use only as directed and only as much as you need.  Corticosteroid injections may be given by your caregiver. These injections should be reserved for the most serious cases, because they may only be given a certain number of times.  HEAT AND COLD  Cold treatment (icing) relieves pain and reduces inflammation. Cold treatment should be applied for 10 to 15 minutes every 2 to 3 hours for inflammation and pain and immediately after any activity that aggravates your symptoms. Use ice packs or massage the area with a piece of ice (ice massage).  Heat treatment may be used prior to performing the stretching and strengthening activities prescribed by your caregiver, physical therapist, or athletic trainer. Use a heat pack or soak the injury in warm water.  SEEK IMMEDIATE MEDICAL CARE   IF:  Treatment seems to offer no benefit, or the condition worsens.  Any medications produce adverse side effects.  EXERCISES- RANGE OF  MOTION (ROM) AND STRETCHING EXERCISES - Plantar Fasciitis (Heel Spur Syndrome) These exercises may help you when beginning to rehabilitate your injury. Your symptoms may resolve with or without further involvement from your physician, physical therapist or athletic trainer. While completing these exercises, remember:   Restoring tissue flexibility helps normal motion to return to the joints. This allows healthier, less painful movement and activity.  An effective stretch should be held for at least 30 seconds.  A stretch should never be painful. You should only feel a gentle lengthening or release in the stretched tissue.  RANGE OF MOTION - Toe Extension, Flexion  Sit with your right / left leg crossed over your opposite knee.  Grasp your toes and gently pull them back toward the top of your foot. You should feel a stretch on the bottom of your toes and/or foot.  Hold this stretch for 10 seconds.  Now, gently pull your toes toward the bottom of your foot. You should feel a stretch on the top of your toes and or foot.  Hold this stretch for 10 seconds. Repeat  times. Complete this stretch 3 times per day.   RANGE OF MOTION - Ankle Dorsiflexion, Active Assisted  Remove shoes and sit on a chair that is preferably not on a carpeted surface.  Place right / left foot under knee. Extend your opposite leg for support.  Keeping your heel down, slide your right / left foot back toward the chair until you feel a stretch at your ankle or calf. If you do not feel a stretch, slide your bottom forward to the edge of the chair, while still keeping your heel down.  Hold this stretch for 10 seconds. Repeat 3 times. Complete this stretch 2 times per day.   STRETCH  Gastroc, Standing  Place hands on wall.  Extend right / left leg, keeping the front knee somewhat bent.  Slightly point your toes inward on your back foot.  Keeping your right / left heel on the floor and your knee straight, shift  your weight toward the wall, not allowing your back to arch.  You should feel a gentle stretch in the right / left calf. Hold this position for 10 seconds. Repeat 3 times. Complete this stretch 2 times per day.  STRETCH  Soleus, Standing  Place hands on wall.  Extend right / left leg, keeping the other knee somewhat bent.  Slightly point your toes inward on your back foot.  Keep your right / left heel on the floor, bend your back knee, and slightly shift your weight over the back leg so that you feel a gentle stretch deep in your back calf.  Hold this position for 10 seconds. Repeat 3 times. Complete this stretch 2 times per day.  STRETCH  Gastrocsoleus, Standing  Note: This exercise can place a lot of stress on your foot and ankle. Please complete this exercise only if specifically instructed by your caregiver.   Place the ball of your right / left foot on a step, keeping your other foot firmly on the same step.  Hold on to the wall or a rail for balance.  Slowly lift your other foot, allowing your body weight to press your heel down over the edge of the step.  You should feel a stretch in your right / left calf.  Hold this position   for 10 seconds.  Repeat this exercise with a slight bend in your right / left knee. Repeat 3 times. Complete this stretch 2 times per day.   STRENGTHENING EXERCISES - Plantar Fasciitis (Heel Spur Syndrome)  These exercises may help you when beginning to rehabilitate your injury. They may resolve your symptoms with or without further involvement from your physician, physical therapist or athletic trainer. While completing these exercises, remember:   Muscles can gain both the endurance and the strength needed for everyday activities through controlled exercises.  Complete these exercises as instructed by your physician, physical therapist or athletic trainer. Progress the resistance and repetitions only as guided.  STRENGTH - Towel Curls  Sit in  a chair positioned on a non-carpeted surface.  Place your foot on a towel, keeping your heel on the floor.  Pull the towel toward your heel by only curling your toes. Keep your heel on the floor. Repeat 3 times. Complete this exercise 2 times per day.  STRENGTH - Ankle Inversion  Secure one end of a rubber exercise band/tubing to a fixed object (table, pole). Loop the other end around your foot just before your toes.  Place your fists between your knees. This will focus your strengthening at your ankle.  Slowly, pull your big toe up and in, making sure the band/tubing is positioned to resist the entire motion.  Hold this position for 10 seconds.  Have your muscles resist the band/tubing as it slowly pulls your foot back to the starting position. Repeat 3 times. Complete this exercises 2 times per day.  Document Released: 11/19/2005 Document Revised: 02/11/2012 Document Reviewed: 03/03/2009 [employer] Patient Information 2014 [employer].  

## 2021-07-11 ENCOUNTER — Other Ambulatory Visit: Payer: Self-pay | Admitting: Podiatry

## 2021-07-11 DIAGNOSIS — S92501S Displaced unspecified fracture of right lesser toe(s), sequela: Secondary | ICD-10-CM

## 2021-08-17 ENCOUNTER — Ambulatory Visit (INDEPENDENT_AMBULATORY_CARE_PROVIDER_SITE_OTHER): Payer: No Typology Code available for payment source | Admitting: Podiatry

## 2021-08-17 ENCOUNTER — Ambulatory Visit (INDEPENDENT_AMBULATORY_CARE_PROVIDER_SITE_OTHER): Payer: No Typology Code available for payment source

## 2021-08-17 ENCOUNTER — Other Ambulatory Visit: Payer: Self-pay

## 2021-08-17 DIAGNOSIS — S92501A Displaced unspecified fracture of right lesser toe(s), initial encounter for closed fracture: Secondary | ICD-10-CM

## 2021-08-17 DIAGNOSIS — M722 Plantar fascial fibromatosis: Secondary | ICD-10-CM | POA: Diagnosis not present

## 2021-08-17 DIAGNOSIS — S92501D Displaced unspecified fracture of right lesser toe(s), subsequent encounter for fracture with routine healing: Secondary | ICD-10-CM

## 2021-08-17 DIAGNOSIS — M7750 Other enthesopathy of unspecified foot: Secondary | ICD-10-CM | POA: Diagnosis not present

## 2021-08-17 NOTE — Progress Notes (Signed)
Subjective: 65 year old female presents the office today for follow-up evaluation of fracture to the right fourth toe.  She said the toe is doing better not having any pain.  Regards to the left foot she is still getting discomfort.  Describes a sharp, burning pain at times.  No recent injury or changes otherwise.    Objective: AAO x3, NAD DP/PT pulses palpable bilaterally, CRT less than 3 seconds There is no significant tenderness palpation of right foot particular the fourth digit.  Trace edema but there is no erythema or warmth.  No open lesions.  The fourth toe sits in rectus position although there are multiple digital contractures present bilaterally. On the left side there is edema present in the course of the peroneal tendon as well as lateral ankle ligament complex but no significant area of tenderness identified.  There is no ankle instability present.  There is slight discomfort along the course of plantar fascial left side.  No area of pinpoint tenderness.  MMT 5/5 bilaterally.  No open lesions or pre-ulcerative lesions.  No pain with calf compression, swelling, warmth, erythema  Assessment: 65 year old female with right fourth toe fracture, left ankle, foot tendinitis/swelling  Plan: -All treatment options discussed with the patient including all alternatives, risks, complications.  -Repeat x-rays obtained reviewed of the right foot.  Oblique fracture of the proximal phalanx of the fourth digit with mild displacement there is some callus formation present.  There does appear to be some improvement compared to previous x-rays. -On the right foot she denies any significant discomfort.  She is back to her regular shoes.  Activity as tolerated but there is any increase in pain or swelling we will repeat x-rays. -For the left foot continue stretching, icing daily.  Discussed shoes and good arch support. -Patient encouraged to call the office with any questions, concerns, change in symptoms.    Trula Slade DPM

## 2021-10-23 ENCOUNTER — Encounter (INDEPENDENT_AMBULATORY_CARE_PROVIDER_SITE_OTHER): Payer: No Typology Code available for payment source | Admitting: Ophthalmology

## 2021-10-23 ENCOUNTER — Other Ambulatory Visit: Payer: Self-pay | Admitting: Hematology and Oncology

## 2021-10-23 DIAGNOSIS — D0511 Intraductal carcinoma in situ of right breast: Secondary | ICD-10-CM

## 2021-11-01 ENCOUNTER — Other Ambulatory Visit: Payer: Self-pay

## 2021-11-01 ENCOUNTER — Ambulatory Visit
Admission: RE | Admit: 2021-11-01 | Discharge: 2021-11-01 | Disposition: A | Discharge status: Home / Self Care | Payer: No Typology Code available for payment source | Source: Ambulatory Visit | Attending: Hematology and Oncology | Admitting: Hematology and Oncology

## 2021-11-01 ENCOUNTER — Encounter (INDEPENDENT_AMBULATORY_CARE_PROVIDER_SITE_OTHER): Payer: No Typology Code available for payment source | Admitting: Ophthalmology

## 2021-11-01 DIAGNOSIS — D0511 Intraductal carcinoma in situ of right breast: Secondary | ICD-10-CM

## 2021-11-05 NOTE — Assessment & Plan Note (Signed)
12/22/2020: Right lumpectomy Cindy Roman): Intermediate grade DCIS, 1.2 cm, clear margins, ER 95%, PR 40% Tis NX stage 0  COMETclinical trial: Randomized to surgery  Treatment plan: 1.Patient refused adjuvant radiation 2.Adjuvant antiestrogen therapy with tamoxifen x5 years  Tamoxifen toxicities: Denies any hot flashes. Very occasional cramps in the legs but they do not bother her. Denies any arthralgias or myalgias.  Breast cancer surveillance: Mammogram 11/01/21: Benign Density Cat B  Return to clinic in 1 year for follow-up

## 2021-11-05 NOTE — Progress Notes (Signed)
Patient Care Team: Carol Ada, MD as PCP - General (Family Medicine) Mauro Kaufmann, RN as Oncology Nurse Navigator Rockwell Germany, RN as Oncology Nurse Navigator  DIAGNOSIS:    ICD-10-CM   1. Ductal carcinoma in situ (DCIS) of right breast  D05.11       SUMMARY OF ONCOLOGIC HISTORY: Oncology History  Ductal carcinoma in situ (DCIS) of right breast  10/12/2020 Initial Diagnosis   Screening mammogram showed right breast calcifications, spanning 2.0cm. 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    10/12/2020 Cancer Staging   Staging form: Breast, AJCC 8th Edition - Clinical stage from 10/12/2020: Stage 0 (cTis (DCIS), cN0, cM0, ER+, PR+) - Signed by Gardenia Phlegm, NP on 10/12/2020    12/22/2020 Surgery   Right lumpectomy [name]): intermediate grade DCIS, 1.2cm, clear margins (COMET clinical trial)     CHIEF COMPLIANT: Follow-up of right breast DCIS on tamoxifen  INTERVAL HISTORY: Cindy Roman is a 65 y.o. with above-mentioned history of right breast DCIS. She is a participant in the COMET clinical trial and was randomized to the surgery arm. She underwent a right lumpectomy and is currently on antiestrogen therapy with tamoxifen. Mammogram on 11/01/2021 showed no evidence of malignancy. She presents to the clinic today for follow-up.  Other than mild muscle cramps and mild hot flashes she is tolerating tamoxifen extremely well.  Denies any lumps or nodules in the breast.  ALLERGIES:  is allergic to penicillins.  MEDICATIONS:  Current Outpatient Medications  Medication Sig Dispense Refill   Ascorbic Acid (VITAMIN C PO) Take by mouth.     meloxicam (MOBIC) 15 MG tablet Take 1 tablet (15 mg total) by mouth daily. 30 tablet 0   Misc Natural Products (DAILY HERBS IMMUNE DEFENSE PO) Take by mouth.     Multiple Vitamins-Minerals (PRESERVISION AREDS PO) Take by mouth.     Nutritional Supplements (JUICE PLUS  FIBRE PO) Take by mouth.     tamoxifen (NOLVADEX) 20 MG tablet Take 1 tablet (20 mg total) by mouth daily. 90 tablet 3   tamoxifen (NOLVADEX) 20 MG tablet 1 tablet     No current facility-administered medications for this visit.    PHYSICAL EXAMINATION: ECOG PERFORMANCE STATUS: 1 - Symptomatic but completely ambulatory  Vitals:   11/06/21 0853  BP: (!) 152/75  Pulse: 98  Resp: (!) 97  Temp: (!) 97.3 F (36.3 C)  SpO2: 98%   Filed Weights   11/06/21 0853  Weight: 193 lb 8 oz (87.8 kg)    BREAST: No palpable masses or nodules in either right or left breasts. No palpable axillary supraclavicular or infraclavicular adenopathy no breast tenderness or nipple discharge. (exam performed in the presence of a chaperone)  LABORATORY DATA:  I have reviewed the data as listed CMP Latest Ref Rng & Units 05/03/2009  Glucose 70 - 99 mg/dL 130(H)  BUN 6 - 23 mg/dL 11  Creatinine 0.4 - 1.2 mg/dL 0.81  Sodium 135 - 145 mEq/L 138  Potassium 3.5 - 5.1 mEq/L 4.8  Chloride 96 - 112 mEq/L 105  CO2 19 - 32 mEq/L 27  Calcium 8.4 - 10.5 mg/dL 9.4  Total Protein 6.0 - 8.3 g/dL 6.7  Total Bilirubin 0.3 - 1.2 mg/dL 0.7  Alkaline Phos 39 - 117 U/L 69  AST 0 - 37 U/L 22  ALT 0 - 35 U/L 25    Lab Results  Component Value Date   WBC  5.7 05/03/2009   HGB 12.9 05/03/2009   HCT 38.5 05/03/2009   MCV 93.4 05/03/2009   PLT 174 05/03/2009   NEUTROABS 3.0 05/03/2009    ASSESSMENT & PLAN:  Ductal carcinoma in situ (DCIS) of right breast 12/22/2020: Right lumpectomy [name]): Intermediate grade DCIS, 1.2 cm, clear margins, ER 95%, PR 40% Tis NX stage 0   COMET clinical trial: Randomized to surgery   Treatment plan: 1. Patient refused adjuvant radiation 2. Adjuvant antiestrogen therapy with tamoxifen x5 years   Tamoxifen toxicities:  Mild hot flashes Very occasional cramps in the legs but they do not bother her.     Breast cancer surveillance: Mammogram 11/01/21: Benign Density Cat  B Breast exam 11/06/2021: Benign  She is going to [place] to be with her daughter for Christmas time.  Her daughter lives near [place].  Return to clinic in 6 months for follow-up      No orders of the defined types were placed in this encounter.  The patient has a good understanding of the overall plan. she agrees with it. she will call with any problems that may develop before the next visit here.  Total time spent: 20 mins including face to face time and time spent for planning, charting and coordination of care  Rulon Eisenmenger, MD, MPH 11/06/2021  I, Thana Ates, am acting as scribe for Dr. Nicholas Lose.  I have reviewed the above documentation for accuracy and completeness, and I agree with the above.

## 2021-11-06 ENCOUNTER — Inpatient Hospital Stay: Payer: No Typology Code available for payment source

## 2021-11-06 ENCOUNTER — Encounter: Payer: Self-pay | Admitting: *Deleted

## 2021-11-06 ENCOUNTER — Inpatient Hospital Stay
Payer: No Typology Code available for payment source | Attending: Hematology and Oncology | Admitting: Hematology and Oncology

## 2021-11-06 ENCOUNTER — Other Ambulatory Visit: Payer: Self-pay

## 2021-11-06 DIAGNOSIS — Z79899 Other long term (current) drug therapy: Secondary | ICD-10-CM | POA: Diagnosis not present

## 2021-11-06 DIAGNOSIS — D0511 Intraductal carcinoma in situ of right breast: Secondary | ICD-10-CM | POA: Insufficient documentation

## 2021-11-06 DIAGNOSIS — Z170 Estrogen receptor positive status [ER+]: Secondary | ICD-10-CM | POA: Diagnosis not present

## 2021-11-06 DIAGNOSIS — R252 Cramp and spasm: Secondary | ICD-10-CM | POA: Insufficient documentation

## 2021-11-06 DIAGNOSIS — R232 Flushing: Secondary | ICD-10-CM | POA: Insufficient documentation

## 2021-11-06 DIAGNOSIS — Z79810 Long term (current) use of selective estrogen receptor modulators (SERMs): Secondary | ICD-10-CM | POA: Insufficient documentation

## 2021-11-06 LAB — RESEARCH LABS

## 2021-11-06 MED ORDER — TAMOXIFEN CITRATE 20 MG PO TABS: 20.0000 mg | ORAL_TABLET | Freq: Every day | ORAL | 3 refills | Status: DC

## 2021-11-06 NOTE — Research (Signed)
AFT - 25: COMPARING AN OPERATION TO MONITORING, WITH OR WITHOUT ENDOCRINE THERAPY (COMET) FOR LOW RISK DCIS: A PHASE III PROSPECTIVE RANDOMIZED TRIAL   12- month study visit    Patient in clinic today for her 74-month study visit. Patient confirms that she is taking her tamoxifen as instructed. Solicited AE's were reviewed with patient, and documented below. Patient had lumpectomy December 22, 2020 and reports to be doing well today. All patient's questions and concerns were addressed by MD, with patient, in clinic today.   Research labs - Pt's month 12 labs were drawn today, and they will be shipped today.   Mammogram: Yearly bilateral mammogram was completed on 11/01/21.  Dr. Lindi Adie reviewed the results with the pt.  Mammogram showed no evidence of malignancy.    Solicited and Other/Adverse Events: Reviewed with patient and documented below. Patient with no hospital admissions and no SAE's.  Questionnaires: Patient completed the 12 month questionnaires on 11/03/21.  The pt was thanked for completing her questionnaires in a timely manner.     Plan: Mrs. Galli will be scheduled to return in approximately 6 months for MD visit on 05/07/22.  I thanked patient for her continued participation and support of study and encouraged her to contact Dr. Lindi Adie or research with any questions or concerns she may have.  ODENA MCQUAID [id number]  11/06/2021  Adverse Event Log  Study/Protocol: AFT-25/COMET  Cycle: 12 month visit  Research nurse obtained attributions from Dr. Lindi Adie on 11/06/21.  Event Grade Onset Date Resolved Date Drug Name Attribution Treatment Comments  Hot flashes  Grade 1 05/05/21 Ongoing  Tamoxifen  Probably None reported "Mild" per pt   Arthralgia  Grade 2  At baseline Ongoing  Tamoxifen N/A- Unrelated    Myalgia  Grade 1 05/05/21 Ongoing  Tamoxifen Probably None reported  "Mild muscle cramps in legs at bedtime".   Not evaluated Solicited AE's include the following:  acute coronary  syndrome, ischemia cerebrovascular, osteoporosis, cholesterol Pt denied the following Solicited AE's:  allergic reaction, fever, hypertension, nausea, and fracture Baseline solicited AE - arthralgia - per patient- her occasional, moderate hip pain has not worsened since starting the study and tamoxifen.   Brion Aliment RN, BSN, CCRP Clinical Research Nurse Lead 11/06/2021 10:50 AM

## 2022-03-27 ENCOUNTER — Encounter (HOSPITAL_COMMUNITY): Payer: Self-pay

## 2022-04-12 ENCOUNTER — Telehealth: Payer: Self-pay | Admitting: Hematology and Oncology

## 2022-04-12 NOTE — Telephone Encounter (Signed)
Rescheduled appointment per provider PAL. Patient is aware of the changes made to her upcoming appointment. 

## 2022-05-07 ENCOUNTER — Ambulatory Visit: Payer: No Typology Code available for payment source | Admitting: Hematology and Oncology

## 2022-05-10 NOTE — Progress Notes (Signed)
Patient Care Team: Carol Ada, MD as PCP - General (Family Medicine) Mauro Kaufmann, RN as Oncology Nurse Navigator Rockwell Germany, RN as Oncology Nurse Navigator  DIAGNOSIS:  Encounter Diagnosis  Name Primary?   Ductal carcinoma in situ (DCIS) of right breast     SUMMARY OF ONCOLOGIC HISTORY: Oncology History  Ductal carcinoma in situ (DCIS) of right breast  10/12/2020 Initial Diagnosis   Screening mammogram showed right breast calcifications, spanning 2.0cm. 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    10/12/2020 Cancer Staging   Staging form: Breast, AJCC 8th Edition - Clinical stage from 10/12/2020: Stage 0 (cTis (DCIS), cN0, cM0, ER+, PR+) - Signed by Gardenia Phlegm, NP on 10/12/2020   12/22/2020 Surgery   Right lumpectomy [name]): intermediate grade DCIS, 1.2cm, clear margins (COMET clinical trial)     CHIEF COMPLIANT: Follow-up of right breast DCIS on Comet clinical trial    INTERVAL HISTORY: Cindy Roman is a   66 y.o. with above-mentioned history of right breast DCIS. She presents to the clinic today for follow-up. States that she is tolerating the Tamoxifen. Denies any symptoms or side effects. States that she has been doing some traveling.   ALLERGIES:  is allergic to penicillins.  MEDICATIONS:  Current Outpatient Medications  Medication Sig Dispense Refill   Ascorbic Acid (VITAMIN C PO) Take by mouth.     doxycycline (VIBRAMYCIN) 100 MG capsule Take 100 mg by mouth 2 (two) times daily.     meloxicam (MOBIC) 15 MG tablet Take 1 tablet (15 mg total) by mouth daily. 30 tablet 0   Misc Natural Products (DAILY HERBS IMMUNE DEFENSE PO) Take by mouth.     Multiple Vitamins-Minerals (PRESERVISION AREDS PO) Take by mouth.     Nutritional Supplements (JUICE PLUS FIBRE PO) Take by mouth.     tamoxifen (NOLVADEX) 20 MG tablet Take 1 tablet (20 mg total) by mouth daily. 90 tablet 3   No  current facility-administered medications for this visit.    PHYSICAL EXAMINATION: ECOG PERFORMANCE STATUS: 1 - Symptomatic but completely ambulatory  Vitals:   05/24/22 0900  BP: (!) 161/80  Pulse: 72  Resp: 17  Temp: 97.7 F (36.5 C)  SpO2: 99%   Filed Weights   05/24/22 0900  Weight: 188 lb 3.2 oz (85.4 kg)    BREAST: No palpable masses or nodules in either right or left breasts. No palpable axillary supraclavicular or infraclavicular adenopathy no breast tenderness or nipple discharge. (exam performed in the presence of a chaperone)  LABORATORY DATA:  I have reviewed the data as listed    Latest Ref Rng & Units 05/03/2009    8:50 AM  CMP  Glucose 70 - 99 mg/dL 130   BUN 6 - 23 mg/dL 11   Creatinine 0.4 - 1.2 mg/dL 0.81   Sodium 135 - 145 mEq/L 138   Potassium 3.5 - 5.1 mEq/L 4.8   Chloride 96 - 112 mEq/L 105   CO2 19 - 32 mEq/L 27   Calcium 8.4 - 10.5 mg/dL 9.4   Total Protein 6.0 - 8.3 g/dL 6.7   Total Bilirubin 0.3 - 1.2 mg/dL 0.7   Alkaline Phos 39 - 117 U/L 69   AST 0 - 37 U/L 22   ALT 0 - 35 U/L 25     Lab Results  Component Value Date   WBC 5.7 05/03/2009   HGB 12.9 05/03/2009   HCT 38.5  05/03/2009   MCV 93.4 05/03/2009   PLT 174 05/03/2009   NEUTROABS 3.0 05/03/2009    ASSESSMENT & PLAN:  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 Patient refused adjuvant radiation 2. Adjuvant antiestrogen therapy with tamoxifen x5 years   Tamoxifen toxicities:  Denies any adverse effects    Breast cancer surveillance: Mammogram 11/01/21: Benign Density Cat B Breast exam 05/24/22: Benign   Patient's daughter lives in [place] and she had been there last December. Return to clinic in 6 months for follow-up       Orders Placed This Encounter  Procedures   MM DIAG BREAST TOMO BILATERAL    Standing Status:   Future    Standing Expiration Date:   05/25/2023    Order Specific Question:   Reason for Exam (SYMPTOM  OR DIAGNOSIS REQUIRED)    Answer:   annual mammograms with DCIS Need Clocl face (COMET)    Order Specific Question:   Preferred imaging location?    Answer:   [facility]   The patient has a good understanding of the overall plan. she agrees with it. she will call with any problems that may develop before the next visit here. Total time spent: 30 mins including face to face time and time spent for planning, charting and co-ordination of care   Harriette Ohara, MD 05/24/22    I Gardiner Coins am scribing for Dr. Lindi Adie  I have reviewed the above documentation for accuracy and completeness, and I agree with the above.

## 2022-05-24 ENCOUNTER — Encounter: Payer: Self-pay | Admitting: Radiology

## 2022-05-24 ENCOUNTER — Inpatient Hospital Stay
Payer: No Typology Code available for payment source | Attending: Hematology and Oncology | Admitting: Hematology and Oncology

## 2022-05-24 ENCOUNTER — Other Ambulatory Visit: Payer: Self-pay

## 2022-05-24 DIAGNOSIS — D0511 Intraductal carcinoma in situ of right breast: Secondary | ICD-10-CM

## 2022-05-24 DIAGNOSIS — Z006 Encounter for examination for normal comparison and control in clinical research program: Secondary | ICD-10-CM | POA: Insufficient documentation

## 2022-05-24 NOTE — Research (Addendum)
AFT - 25: COMPARING AN OPERATION TO MONITORING, WITH OR WITHOUT ENDOCRINE THERAPY (COMET) FOR LOW RISK DCIS: A PHASE III PROSPECTIVE RANDOMIZED TRIAL    18- month study visit    Patient in clinic today for her 51-monthstudy visit. Patient confirms that she is taking her tamoxifen as instructed. Solicited AE's were reviewed with patient, and documented below. Patient had lumpectomy December 22, 2020 and reports to be doing well today. All patient's questions and concerns were addressed by MD, with patient, in clinic today.     Mammogram: Yearly bilateral mammogram was completed on was requested by Dr. GLindi Adiefor 11/2022.   Solicited and Other/Adverse Events: Reviewed with patient and documented below. Patient with no hospital admissions and no SAE's.   Plan: Mrs. FMccafferywill be scheduled to return in approximately 6 months for MD visit in December 2023.  I thanked patient for her continued participation and support of study and encouraged her to contact Dr. GLindi Adieor research with any questions or concerns she may have.   KDINNA [id number]  05/24/2022   Adverse Event Log   Study/Protocol: AFT-25/COMET  Cycle: 18 month visit  Research nurse obtained attributions from Dr. GLindi Adieon 11/06/21.  Event Grade Onset Date Resolved Date Drug Name Attribution Treatment Comments  Hot flashes  Grade 0 05/05/21 05/24/2022     Per pt: No longer having hot flashes  Arthralgia  Grade 0  At baseline 05/24/2022      Per pt: No joint pain  Myalgia  Grade 0 05/05/21 05/24/2022     Per pt: No muscle pain  Not evaluated Solicited AE's include the following:  acute coronary syndrome, ischemia cerebrovascular, osteoporosis, cholesterol Pt denied the following Solicited AE's:  allergic reaction, fever, hypertension, nausea, and fracture Baseline solicited AE - arthralgia - per patient- she is no longer experiencing muscle or joint pain.   Thanked patient for her time and continued support of the above mentioned  study. Informed patient next visit will be the 24 month visit around December 2023. Questionnaires will be completed as well blood collection.   CCarol Ada RT(R)(T) Clinical Research Coordinator

## 2022-05-24 NOTE — Assessment & Plan Note (Addendum)
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 Return to clinic in 6 months for follow-up

## 2022-06-20 ENCOUNTER — Other Ambulatory Visit: Payer: Self-pay | Admitting: *Deleted

## 2022-06-20 DIAGNOSIS — D0511 Intraductal carcinoma in situ of right breast: Secondary | ICD-10-CM

## 2022-11-05 ENCOUNTER — Ambulatory Visit
Admission: RE | Admit: 2022-11-05 | Discharge: 2022-11-05 | Disposition: A | Discharge status: Home / Self Care | Payer: No Typology Code available for payment source | Source: Ambulatory Visit | Attending: Hematology and Oncology | Admitting: Hematology and Oncology

## 2022-11-05 DIAGNOSIS — D0511 Intraductal carcinoma in situ of right breast: Secondary | ICD-10-CM

## 2022-11-08 ENCOUNTER — Other Ambulatory Visit: Payer: No Typology Code available for payment source

## 2022-11-08 ENCOUNTER — Ambulatory Visit: Payer: No Typology Code available for payment source | Admitting: Hematology and Oncology

## 2022-11-13 ENCOUNTER — Inpatient Hospital Stay: Payer: No Typology Code available for payment source | Attending: Hematology and Oncology

## 2022-11-13 ENCOUNTER — Other Ambulatory Visit: Payer: Self-pay

## 2022-11-13 ENCOUNTER — Encounter: Payer: Self-pay | Admitting: *Deleted

## 2022-11-13 ENCOUNTER — Inpatient Hospital Stay (HOSPITAL_BASED_OUTPATIENT_CLINIC_OR_DEPARTMENT_OTHER): Payer: No Typology Code available for payment source | Admitting: Hematology and Oncology

## 2022-11-13 VITALS — BP 150/64 | HR 81 | Temp 97.8°F | Resp 18 | Ht 66.0 in | Wt 194.3 lb

## 2022-11-13 DIAGNOSIS — Z006 Encounter for examination for normal comparison and control in clinical research program: Secondary | ICD-10-CM | POA: Diagnosis not present

## 2022-11-13 DIAGNOSIS — D0511 Intraductal carcinoma in situ of right breast: Secondary | ICD-10-CM

## 2022-11-13 LAB — RESEARCH LABS

## 2022-11-13 MED ORDER — TAMOXIFEN CITRATE 20 MG PO TABS: 20.0000 mg | ORAL_TABLET | Freq: Every day | ORAL | 3 refills | Status: DC

## 2022-11-13 NOTE — Research (Signed)
AFT - 25: COMPARING AN OPERATION TO MONITORING, WITH OR WITHOUT ENDOCRINE THERAPY (COMET) FOR LOW RISK DCIS: A PHASE III PROSPECTIVE RANDOMIZED TRIAL    24- month study visit    Patient in clinic today for her 24 month study visit. Patient confirms that she is taking her tamoxifen as instructed. Solicited AE's were reviewed with patient, and documented below.   All patient's questions and concerns were addressed by the MD and the research nurse.    Research labs - Pt's month 24 labs were drawn today, and they will be shipped today.    Mammogram: Yearly bilateral mammogram was completed on 11/05/22.  Dr. Lindi Adie reviewed the results with the pt.  Mammogram showed no evidence of malignancy.     Solicited and Other/Adverse Events: Reviewed with patient and documented below. Patient with no hospital admissions and no SAE's.   Questionnaires: Patient completed the 24 month questionnaires on 11/12/22.  The pt was thanked for completing her questionnaires in a timely manner.      Plan: Mrs. Wrobleski will be scheduled to return in approximately 6 months for MD visit in June 2024.  I thanked patient for her continued participation and support of study and encouraged her to contact Dr. Lindi Adie or research with any questions or concerns she may have.   ANNAELLE KASEL [id number]   11/13/2022   Adverse Event Log   Study/Protocol: AFT-25/COMET  Cycle: 24 month visit  Research nurse obtained attributions from Dr. Lindi Adie on 11/13/22.  Event Grade Onset Date Resolved Date Drug Name Attribution Treatment Comments  Arthralgia  Grade 1 11/12/22 Ongoing  Tamoxifen Unrelated  None reported  Mainly "wrist" discomfort r/t yoga/exercise  Myalgia  Grade 1 11/12/22 Ongoing  Tamoxifen Unrelated None reported  "Mild muscle cramps"   Not evaluated Solicited AE's include the following:  acute coronary syndrome, ischemia cerebrovascular, osteoporosis, cholesterol Pt denied the following Solicited AE's:  allergic reaction,  fever, hot flashes, hypertension, nausea, and fracture   Brion Aliment RN, BSN, CCRP Clinical Research Nurse Lead 11/13/2022 11:53 AM

## 2022-11-13 NOTE — Assessment & Plan Note (Addendum)
12/22/2020: Right lumpectomy Cindy Roman): Intermediate grade DCIS, 1.2 cm, clear margins, ER 95%, PR 40% Tis NX stage 0   COMET clinical trial: Randomized to surgery   Treatment plan: 1. Patient refused adjuvant radiation 2. Adjuvant antiestrogen therapy with tamoxifen x5 years   Tamoxifen toxicities:  Denies any adverse effects    Breast cancer surveillance: Mammogram 11/05/2022: Benign Density Cat B   Patient's daughter lives in [place]  Return to clinic in 1 year for follow-up

## 2022-11-13 NOTE — Progress Notes (Signed)
Patient Care Team: Carol Ada, MD as PCP - General (Family Medicine) Mauro Kaufmann, RN as Oncology Nurse Navigator Rockwell Germany, RN as Oncology Nurse Navigator  DIAGNOSIS:  Encounter Diagnosis  Name Primary?   Ductal carcinoma in situ (DCIS) of right breast Yes    SUMMARY OF ONCOLOGIC HISTORY: Oncology History  Ductal carcinoma in situ (DCIS) of right breast  10/12/2020 Initial Diagnosis   Screening mammogram showed right breast calcifications, spanning 2.0cm. 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    10/12/2020 Cancer Staging   Staging form: Breast, AJCC 8th Edition - Clinical stage from 10/12/2020: Stage 0 (cTis (DCIS), cN0, cM0, ER+, PR+) - Signed by Gardenia Phlegm, NP on 10/12/2020   12/22/2020 Surgery   Right lumpectomy [name]): intermediate grade DCIS, 1.2cm, clear margins (COMET clinical trial)     CHIEF COMPLIANT: Follow-up of right breast DCIS on Comet clinical trial on tamoxifen  INTERVAL HISTORY: Cindy Roman is a 66 y.o. with above-mentioned history of right breast DCIS. She presents to the clinic today for follow-up. She states that the hot flashes subsided. She says she does have some muscles cramps. Overall she has been doing good.   ALLERGIES:  is allergic to penicillins.  MEDICATIONS:  Current Outpatient Medications  Medication Sig Dispense Refill   Ascorbic Acid (VITAMIN C PO) Take by mouth.     Misc Natural Products (DAILY HERBS IMMUNE DEFENSE PO) Take by mouth.     Multiple Vitamins-Minerals (PRESERVISION AREDS PO) Take by mouth.     Nutritional Supplements (JUICE PLUS FIBRE PO) Take by mouth.     tamoxifen (NOLVADEX) 20 MG tablet Take 1 tablet (20 mg total) by mouth daily. 90 tablet 3   No current facility-administered medications for this visit.    PHYSICAL EXAMINATION: ECOG PERFORMANCE STATUS: 1 - Symptomatic but completely ambulatory  Vitals:   11/13/22 1010   BP: (!) 150/64  Pulse: 81  Resp: 18  Temp: 97.8 F (36.6 C)  SpO2: 100%   Filed Weights   11/13/22 1010  Weight: 194 lb 4.8 oz (88.1 kg)      LABORATORY DATA:  I have reviewed the data as listed    Latest Ref Rng & Units 05/03/2009    8:50 AM  CMP  Glucose 70 - 99 mg/dL 130   BUN 6 - 23 mg/dL 11   Creatinine 0.4 - 1.2 mg/dL 0.81   Sodium 135 - 145 mEq/L 138   Potassium 3.5 - 5.1 mEq/L 4.8   Chloride 96 - 112 mEq/L 105   CO2 19 - 32 mEq/L 27   Calcium 8.4 - 10.5 mg/dL 9.4   Total Protein 6.0 - 8.3 g/dL 6.7   Total Bilirubin 0.3 - 1.2 mg/dL 0.7   Alkaline Phos 39 - 117 U/L 69   AST 0 - 37 U/L 22   ALT 0 - 35 U/L 25     Lab Results  Component Value Date   WBC 5.7 05/03/2009   HGB 12.9 05/03/2009   HCT 38.5 05/03/2009   MCV 93.4 05/03/2009   PLT 174 05/03/2009   NEUTROABS 3.0 05/03/2009    ASSESSMENT & PLAN:  Ductal carcinoma in situ (DCIS) of right breast 12/22/2020: Right lumpectomy [name]): Intermediate grade DCIS, 1.2 cm, clear margins, ER 95%, PR 40% Tis NX stage 0   COMET clinical trial: Randomized to surgery   Treatment plan: 1. Patient refused adjuvant radiation 2. Adjuvant antiestrogen therapy  with tamoxifen x5 years   Tamoxifen toxicities:  Denies any adverse effects    Breast cancer surveillance: Mammogram 11/05/2022: Benign Density Cat B   Patient's daughter lives in [place]  Return to clinic in 1 year for follow-up      No orders of the defined types were placed in this encounter.  The patient has a good understanding of the overall plan. she agrees with it. she will call with any problems that may develop before the next visit here. Total time spent: 30 mins including face to face time and time spent for planning, charting and co-ordination of care   Harriette Ohara, MD 11/13/22    I Gardiner Coins am scribing for Dr. Lindi Adie  I have reviewed the above documentation for accuracy and completeness, and I agree with the  above.

## 2022-11-14 ENCOUNTER — Telehealth: Payer: Self-pay | Admitting: Hematology and Oncology

## 2022-11-14 NOTE — Telephone Encounter (Signed)
Scheduled appointment per 12/12 los. Patient is aware.

## 2023-01-03 NOTE — Progress Notes (Shared)
[facility] Note  01/07/2023     CHIEF COMPLAINT Patient presents for No chief complaint on file.   HISTORY OF PRESENT ILLNESS: Cindy Roman is a 67 y.o. female who presents to the clinic today for:     Referring physician: Carol Ada, MD [place],  [place] [id number]  HISTORICAL INFORMATION:   Selected notes from the MEDICAL RECORD NUMBER Referred by Dr. Truman Hayward:  Ocular Hx- PMH-    CURRENT MEDICATIONS: No current outpatient medications on file. (Ophthalmic Drugs)   No current facility-administered medications for this visit. (Ophthalmic Drugs)   Current Outpatient Medications (Other)  Medication Sig   Ascorbic Acid (VITAMIN C PO) Take by mouth.   Misc Natural Products (DAILY HERBS IMMUNE DEFENSE PO) Take by mouth.   Multiple Vitamins-Minerals (PRESERVISION AREDS PO) Take by mouth.   Nutritional Supplements (JUICE PLUS FIBRE PO) Take by mouth.   tamoxifen (NOLVADEX) 20 MG tablet Take 1 tablet (20 mg total) by mouth daily.   No current facility-administered medications for this visit. (Other)      REVIEW OF SYSTEMS:    ALLERGIES Allergies  Allergen Reactions   Penicillins Hives    Other reaction(s): skin changes    PAST MEDICAL HISTORY Past Medical History:  Diagnosis Date   COVID 12/05/2020   Past Surgical History:  Procedure Laterality Date   ANKLE SURGERY     BREAST BIOPSY Left 2003   BREAST LUMPECTOMY Right 12/22/2020   DCIS   BREAST LUMPECTOMY WITH RADIOACTIVE SEED LOCALIZATION Right 12/22/2020   Procedure: RIGHT BREAST LUMPECTOMY WITH RADIOACTIVE SEED LOCALIZATION;  Surgeon: Rolm Bookbinder, MD;  Location: [facility];  Service: General;  Laterality: Right;   CHOLECYSTECTOMY     WRIST SURGERY      FAMILY HISTORY Family History  Problem Relation Age of Onset   Breast cancer Maternal Aunt     SOCIAL HISTORY Social History   Tobacco Use   Smoking status: Never    Smokeless tobacco: Never  Vaping Use   Vaping Use: Never used  Substance Use Topics   Alcohol use: Yes    Comment: "couple glasses of wine a day"   Drug use: No         OPHTHALMIC EXAM:  Not recorded     IMAGING AND PROCEDURES  Imaging and Procedures for 01/07/2023           ASSESSMENT/PLAN:  No diagnosis found.  1.  2.  3.  Ophthalmic Meds Ordered this visit:  No orders of the defined types were placed in this encounter.      No follow-ups on file.  There are no Patient Instructions on file for this visit.   Explained the diagnoses, plan, and follow up with the patient and they expressed understanding.  Patient expressed understanding of the importance of proper follow up care.   This document serves as a record of services personally performed by Gardiner Sleeper, MD, PhD. It was created on their behalf by Roselee Nova, COMT. The creation of this record is the provider's dictation and/or activities during the visit.  Electronically signed by: Roselee Nova, COMT 01/03/23 2:16 PM    Gardiner Sleeper, M.D., Ph.D. Diseases & Surgery of the Retina and [place] '@TODAY'$ @     Abbreviations: M myopia (nearsighted); A astigmatism; H hyperopia (farsighted); P presbyopia; Mrx spectacle prescription;  CTL contact lenses; OD right eye; OS left eye;  OU both eyes  XT exotropia; ET esotropia; PEK punctate epithelial keratitis; PEE punctate epithelial erosions; DES dry eye syndrome; MGD meibomian gland dysfunction; ATs artificial tears; PFAT's preservative free artificial tears; [place] nuclear sclerotic cataract; PSC posterior subcapsular cataract; ERM epi-retinal membrane; PVD posterior vitreous detachment; RD retinal detachment; DM diabetes mellitus; DR diabetic retinopathy; NPDR non-proliferative diabetic retinopathy; PDR proliferative diabetic retinopathy; CSME clinically significant macular edema; DME diabetic macular edema; dbh dot  blot hemorrhages; CWS cotton wool spot; POAG primary open angle glaucoma; C/D cup-to-disc ratio; HVF humphrey visual field; GVF goldmann visual field; OCT optical coherence tomography; IOP intraocular pressure; BRVO Branch retinal vein occlusion; CRVO central retinal vein occlusion; CRAO central retinal artery occlusion; BRAO branch retinal artery occlusion; RT retinal tear; SB scleral buckle; PPV pars plana vitrectomy; VH Vitreous hemorrhage; PRP panretinal laser photocoagulation; IVK intravitreal kenalog; VMT vitreomacular traction; MH Macular hole;  NVD neovascularization of the disc; NVE neovascularization elsewhere; AREDS age related eye disease study; ARMD age related macular degeneration; POAG primary open angle glaucoma; EBMD epithelial/anterior basement membrane dystrophy; ACIOL anterior chamber intraocular lens; IOL intraocular lens; PCIOL posterior chamber intraocular lens; Phaco/IOL phacoemulsification with intraocular lens placement; [place] photorefractive keratectomy; LASIK laser assisted in situ keratomileusis; HTN hypertension; DM diabetes mellitus; COPD chronic obstructive pulmonary disease

## 2023-01-07 ENCOUNTER — Encounter (INDEPENDENT_AMBULATORY_CARE_PROVIDER_SITE_OTHER): Payer: No Typology Code available for payment source | Admitting: Ophthalmology

## 2023-01-08 ENCOUNTER — Ambulatory Visit (INDEPENDENT_AMBULATORY_CARE_PROVIDER_SITE_OTHER): Payer: Medicare Other | Admitting: Ophthalmology

## 2023-01-08 ENCOUNTER — Encounter (INDEPENDENT_AMBULATORY_CARE_PROVIDER_SITE_OTHER): Payer: Self-pay | Admitting: Ophthalmology

## 2023-01-08 DIAGNOSIS — H353111 Nonexudative age-related macular degeneration, right eye, early dry stage: Secondary | ICD-10-CM | POA: Diagnosis not present

## 2023-01-08 DIAGNOSIS — H353122 Nonexudative age-related macular degeneration, left eye, intermediate dry stage: Secondary | ICD-10-CM

## 2023-01-08 DIAGNOSIS — H25813 Combined forms of age-related cataract, bilateral: Secondary | ICD-10-CM

## 2023-01-08 NOTE — Progress Notes (Signed)
[facility]  01/08/2023     CHIEF COMPLAINT Patient presents for Retina Evaluation   HISTORY OF PRESENT ILLNESS: Cindy Roman is a 67 y.o. female who presents to the clinic today for:   HPI     Retina Evaluation   In both eyes.  Associated Symptoms Floaters.  I, the attending physician,  performed the HPI with the patient and updated documentation appropriately.        Comments   Patient here for Retina Evaluation. Referred by Dr Herbert Deaner. Patient states vision doing ok. No eye pain. OS waters sometimes. In third grade was dx with macular degeneration. Nothing was done. Dr Herbert Deaner wanted a baseline.      Last edited by Bernarda Caffey, MD on 01/08/2023  4:52 PM.    Pt is here on the referral of Dr. Herbert Deaner for retinal eval, pt was told she had macular degeneration when she was in the 3rd grade, she states she went to [facility] in [place], [place] at the time, she states since then, no one else has told her she has it, pt doesn't feel like the left eye vision is any weaker than the right, she states the left eye waters occasionally and she has floaters in the left eye, pt denies being diabetic or hypertensive  Referring physician: Monna Fam, MD [place],  [place] [address]  HISTORICAL INFORMATION:   Selected notes from the MEDICAL RECORD NUMBER Referred by Dr. Herbert Deaner for maculopathy OS LEE:  Ocular Hx- PMH-    CURRENT MEDICATIONS: No current outpatient medications on file. (Ophthalmic Drugs)   No current facility-administered medications for this visit. (Ophthalmic Drugs)   Current Outpatient Medications (Other)  Medication Sig   Ascorbic Acid (VITAMIN C PO) Take by mouth.   Misc Natural Products (DAILY HERBS IMMUNE DEFENSE PO) Take by mouth.   Multiple Vitamins-Minerals (PRESERVISION AREDS PO) Take by mouth.   Nutritional Supplements (JUICE PLUS FIBRE PO) Take by mouth.   tamoxifen (NOLVADEX) 20 MG tablet  Take 1 tablet (20 mg total) by mouth daily.   No current facility-administered medications for this visit. (Other)   REVIEW OF SYSTEMS: ROS   Positive for: Eyes Last edited by Theodore Demark, COA on 01/08/2023  1:55 PM.     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   BREAST LUMPECTOMY WITH RADIOACTIVE SEED LOCALIZATION Right 12/22/2020   Procedure: RIGHT BREAST LUMPECTOMY WITH RADIOACTIVE SEED LOCALIZATION;  Surgeon: Rolm Bookbinder, MD;  Location: [facility];  Service: General;  Laterality: Right;   CHOLECYSTECTOMY     WRIST SURGERY     FAMILY HISTORY Family History  Problem Relation Age of Onset   Breast cancer Maternal Aunt    SOCIAL HISTORY Social History   Tobacco Use   Smoking status: Never   Smokeless tobacco: Never  Vaping Use   Vaping Use: Never used  Substance Use Topics   Alcohol use: Yes    Comment: "couple glasses of wine a day"   Drug use: No       OPHTHALMIC EXAM:  Base Eye Exam     Visual Acuity (Snellen - Linear)  Right Left   Dist [place] 20/40 20/40 -2   Dist ph [place] 20/30 +2 NI         Tonometry (Tonopen, 1:51 PM)       Right Left   Pressure 22 21         Pupils       Dark Light Shape React APD   Right 3 2 Round Brisk None   Left 3 2 Round Brisk None         Visual Fields (Counting fingers)       Left Right    Full Full         Extraocular Movement       Right Left    Full, Ortho Full, Ortho         Dilation     Both eyes: 1.0% Mydriacyl, 2.5% Phenylephrine @ 1:50 PM           Slit Lamp and Fundus Exam     Slit Lamp Exam       Right Left   Lids/Lashes Dermatochalasis - upper lid Dermatochalasis - upper  lid   Conjunctiva/Sclera White and quiet White and quiet   Cornea trace PEE 1-2+PEE, tear film debris   Anterior Chamber deep and clear deep and clear   Iris Round and dilated Round and dilated   Lens 2+ Nuclear sclerosis, 2-3+ Cortical cataract 2+ Nuclear sclerosis, 2-3+ Cortical cataract   Anterior Vitreous Vitreous syneresis Vitreous syneresis         Fundus Exam       Right Left   Disc Pink and Sharp, temporal PPA/PPP Pink and Sharp, mild PPP, +SVP   C/D Ratio 0.3 0.3   Macula Flat, Good foveal reflex, RPE mottling, rare drusen; No heme or edema Flat, Good foveal reflex, RPE mottling and clumping, drusen, No heme or edema   Vessels mild attenuation, mild tortuosity attenuated, mild tortuosity, mild AV crossing changes   Periphery Attached, peripheral cystoid degeneration Attached, mild reticular degeneration           Refraction     Manifest Refraction       Sphere Cylinder Axis Dist [place]   Right -0.50 Sphere  20/30   Left -0.75 +0.25 012 20/30           IMAGING AND PROCEDURES  Imaging and Procedures for 01/08/2023  OCT, Retina - OU - Both Eyes       Right Eye Quality was good. Central Foveal Thickness: 284. Progression has no prior data. Findings include normal foveal contour, no IRF, no SRF, retinal drusen (Partial PVD, focal peripapillary PED / [facility] caught on widefield).   Left Eye Quality was good. Central Foveal Thickness: 272. Progression has no prior data. Findings include normal foveal contour, no IRF, no SRF, retinal drusen (Partial PVD, focal peripapillary PED / [facility] caught on widefield, foveal notch).   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 CME - cystoid  macular edema. PED - pigment epithelial detachment. IRF - intraretinal fluid. SRF - subretinal fluid. EZ - ellipsoid zone. ERM - epiretinal membrane. ORA - outer retinal atrophy. ORT - outer retinal tubulation. SRHM - subretinal hyper-reflective material. IRHM - intraretinal hyper-reflective material  Fluorescein Angiography Optos (Transit OS)       Right Eye Progression has no prior data. Early phase findings include normal observations. Mid/Late phase findings include normal observations.   Left Eye Progression has no prior data. Early phase findings include staining. Mid/Late phase findings include staining (No leakage, no CNV).   Notes **Images stored on drive**  Impression: OD: normal study OS: No leakage, no CNV            ASSESSMENT/PLAN:    ICD-10-CM   1. Intermediate stage nonexudative age-related macular degeneration of left eye  H35.3122 OCT, Retina - OU - Both Eyes    Fluorescein Angiography Optos (Transit OS)    2. 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 Combined forms of age-related cataract of both eyes  H25.813      1,2. Age related macular degeneration, non-exudative, both eyes (OS > OD) - pt reports history of being diagnosed with macular degeneration in 3rd grade - BCVA 20/30 OU by Mrx - exam shows significant drusen and RPE atrophy in posterior pole OS, while OD has minimal drusen - OCT shows drusen OU (OS >> OD) - FA 2.6.24 shows staining OS, but no CNV; OD normal study  - The incidence, anatomy, and pathology of dry AMD, risk of progression, and the AREDS and AREDS 2 study including smoking risks discussed with patient.  - Recommend amsler grid monitoring  - differential includes some sort of macular dystrophy OS - no retinal or ophthalmic interventions indicated or recommended at this time  - f/u 3-4 months, sooner  prn - DFE, OCT  3. Mixed Cataract OU - The symptoms of cataract, surgical options, and treatments and risks were discussed with patient. - discussed diagnosis and progression - not yet visually significant - monitor for now  Ophthalmic Meds Ordered this visit:  No orders of the defined types were placed in this encounter.    Return for f/u 3-4 months, non-exu ARMD OU, Dilated Exam, OCT.  There are no Patient Instructions on file for this visit.   Explained the diagnoses, plan, and follow up with the patient and they expressed understanding.  Patient expressed understanding of the importance of proper follow up care.   This document serves as a record of services personally performed by Gardiner Sleeper, MD, PhD. It was created on their behalf by San Jetty. Owens Shark, OA an ophthalmic technician. The creation of this record is the provider's dictation and/or activities during the visit.    Electronically signed by: San Jetty. Owens Shark, [place] 02.06.2024 4:54 PM   Gardiner Sleeper, M.D., Ph.D. Diseases & Surgery of the Retina and Vitreous [facility]  I have reviewed the above documentation for accuracy and completeness, and I agree with the above. Gardiner Sleeper, M.D., Ph.D. 01/08/23 4:59 PM   Abbreviations: M myopia (nearsighted); A astigmatism; H hyperopia (farsighted); P presbyopia; Mrx spectacle prescription;  CTL contact lenses; OD right eye; OS left eye; OU both eyes  XT exotropia; ET esotropia; PEK punctate epithelial keratitis; PEE punctate epithelial erosions; DES dry eye syndrome; MGD meibomian gland dysfunction; ATs artificial tears; PFAT's preservative free artificial tears; [place] nuclear sclerotic cataract; PSC posterior subcapsular cataract; ERM epi-retinal membrane; PVD posterior vitreous detachment; RD retinal detachment; DM diabetes mellitus; DR diabetic retinopathy; NPDR non-proliferative diabetic retinopathy; PDR proliferative diabetic retinopathy; CSME  clinically  significant macular edema; DME diabetic macular edema; dbh dot blot hemorrhages; CWS cotton wool spot; POAG primary open angle glaucoma; C/D cup-to-disc ratio; HVF humphrey visual field; GVF goldmann visual field; OCT optical coherence tomography; IOP intraocular pressure; BRVO Branch retinal vein occlusion; CRVO central retinal vein occlusion; CRAO central retinal artery occlusion; BRAO branch retinal artery occlusion; RT retinal tear; SB scleral buckle; PPV pars plana vitrectomy; VH Vitreous hemorrhage; PRP panretinal laser photocoagulation; IVK intravitreal kenalog; VMT vitreomacular traction; MH Macular hole;  NVD neovascularization of the disc; NVE neovascularization elsewhere; AREDS age related eye disease study; ARMD age related macular degeneration; POAG primary open angle glaucoma; EBMD epithelial/anterior basement membrane dystrophy; ACIOL anterior chamber intraocular lens; IOL intraocular lens; PCIOL posterior chamber intraocular lens; Phaco/IOL phacoemulsification with intraocular lens placement; [place] photorefractive keratectomy; LASIK laser assisted in situ keratomileusis; HTN hypertension; DM diabetes mellitus; COPD chronic obstructive pulmonary disease

## 2023-03-26 NOTE — Progress Notes (Signed)
[facility] - Clinic Note  04/09/2023     CHIEF COMPLAINT Patient presents for Retina Follow Up   HISTORY OF PRESENT ILLNESS: Cindy Roman is a 67 y.o. female who presents to the clinic today for:   HPI     Retina Follow Up   Patient presents with  Dry AMD.  In both eyes.  This started 3 months ago.  Duration of 3 months.  Since onset it is stable.  I, the attending physician,  performed the HPI with the patient and updated documentation appropriately.        Comments   3 month retina follow up AMD OU pt is reporting no vision changes noticed pt denies any vision changes she has floaters but denies flashes of light       Last edited by Rennis Chris, MD on 04/13/2023  1:35 AM.    Pt states vision is the same   Referring physician: Merri Brunette, MD [phone] [place],  [place] [address]  HISTORICAL INFORMATION:   Selected notes from the MEDICAL RECORD NUMBER Referred by Dr. Elmer Picker for maculopathy OS LEE:  Ocular Hx- PMH-    CURRENT MEDICATIONS: No current outpatient medications on file. (Ophthalmic Drugs)   No current facility-administered medications for this visit. (Ophthalmic Drugs)   Current Outpatient Medications (Other)  Medication Sig   Ascorbic Acid (VITAMIN C PO) Take by mouth.   Misc Natural Products (DAILY HERBS IMMUNE DEFENSE PO) Take by mouth.   Multiple Vitamins-Minerals (PRESERVISION AREDS PO) Take by mouth.   Nutritional Supplements (JUICE PLUS FIBRE PO) Take by mouth.   tamoxifen (NOLVADEX) 20 MG tablet Take 1 tablet (20 mg total) by mouth daily.   No current facility-administered medications for this visit. (Other)   REVIEW OF SYSTEMS: ROS   Positive for: Eyes Last edited by Etheleen Mayhew, COT on 04/09/2023  9:08 AM.      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   BREAST LUMPECTOMY WITH RADIOACTIVE SEED LOCALIZATION Right 12/22/2020   Procedure: RIGHT BREAST LUMPECTOMY WITH RADIOACTIVE SEED LOCALIZATION;  Surgeon: Emelia Loron, MD;  Location: [facility];  Service: General;  Laterality: Right;   CHOLECYSTECTOMY     WRIST SURGERY     FAMILY HISTORY Family History  Problem Relation Age of Onset   Breast cancer Maternal Aunt    SOCIAL HISTORY Social History   Tobacco Use   Smoking status: Never   Smokeless tobacco: Never  Vaping Use   Vaping Use: Never used  Substance Use Topics   Alcohol use: Yes    Comment: "couple glasses of wine a day"   Drug use: No       OPHTHALMIC EXAM:  Base Eye Exam     Visual Acuity (Snellen - Linear)       Right Left   Dist [place] 20/30 -3 20/40 +1   Dist ph  NI NI         Tonometry (Tonopen, 9:15 AM)       Right Left   Pressure 17 18         Pupils  Pupils Dark Light Shape React APD   Right PERRL 3 2 Round Brisk None   Left PERRL 3 2 Round Brisk None         Visual Fields       Left Right    Full Full         Extraocular Movement       Right Left    Full, Ortho Full, Ortho         Neuro/Psych     Oriented x3: Yes   Mood/Affect: Normal         Dilation     Both eyes: 2.5% Phenylephrine @ 9:15 AM           Slit Lamp and Fundus Exam     Slit Lamp Exam       Right Left   Lids/Lashes Dermatochalasis - upper lid Dermatochalasis - upper lid   Conjunctiva/Sclera White and quiet White and quiet   Cornea trace PEE 1-2+PEE, tear film debris   Anterior Chamber deep and clear deep and clear   Iris Round and dilated Round and dilated   Lens 2+ Nuclear sclerosis, 2-3+ Cortical cataract 2+ Nuclear sclerosis, 2-3+ Cortical cataract   Anterior Vitreous Vitreous syneresis Vitreous syneresis          Fundus Exam       Right Left   Disc Pink and Sharp, temporal PPA/PPP Pink and Sharp, mild PPP, +SVP   C/D Ratio 0.3 0.3   Macula Flat, Good foveal reflex, RPE mottling, rare drusen; No heme or edema Flat, Good foveal reflex, RPE mottling and clumping, drusen, No heme or edema   Vessels attenuated, mild tortuosity attenuated, Tortuous   Periphery Attached, peripheral cystoid degeneration Attached, mild reticular degeneration           IMAGING AND PROCEDURES  Imaging and Procedures for 04/09/2023  OCT, Retina - OU - Both Eyes       Right Eye Quality was good. Central Foveal Thickness: 278. Progression has been stable. Findings include normal foveal contour, no IRF, no SRF, retinal drusen (Partial PVD, focal peripapillary PED / [facility] caught on widefield -- no fluid, stable).   Left Eye Quality was good. Central Foveal Thickness: 269. Progression has been stable. Findings include normal foveal contour, no IRF, no SRF, retinal drusen (Partial PVD, focal peripapillary PED / [facility] caught on widefield -- no fluid, stable, foveal notch).   Notes *Images captured and stored on drive  Diagnosis / Impression:  Nonexudative ARMD OU (OS > OD) OD: Partial PVD, focal peripapillary PED / [facility] caught on widefield -- no fluid, stable OS: Partial PVD, focal peripapillary PED / [facility] caught on widefield -- no fluid, stable, +foveal notch  Clinical management:  See below  Abbreviations: NFP - Normal foveal profile. CME - cystoid macular edema. PED - pigment epithelial detachment. IRF - intraretinal fluid. SRF - subretinal fluid. EZ - ellipsoid zone. ERM - epiretinal membrane. ORA - outer retinal atrophy. ORT - outer retinal tubulation. SRHM - subretinal hyper-reflective material. IRHM - intraretinal hyper-reflective material            ASSESSMENT/PLAN:    ICD-10-CM   1. Intermediate stage nonexudative age-related macular degeneration of left eye  H35.3122 OCT, Retina - OU - Both  Eyes    2. 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 Combined forms of age-related cataract of both eyes  H25.813       1,2. Age  related macular degeneration, non-exudative, both eyes (OS > OD) - pt reports history of being diagnosed with macular degeneration in 3rd grade - BCVA 20/30 OU by Mrx - exam shows significant drusen and RPE atrophy in posterior pole OS, while OD has minimal drusen - OCT shows OD: Partial PVD, focal peripapillary PED / [facility] caught on widefield -- no fluid, stable; OS: Partial PVD, focal peripapillary PED / [facility] caught on widefield -- no fluid, stable, +foveal notch - FA 2.6.24 shows staining OS, but no CNV; OD normal study  - Recommend amsler grid monitoring  - differential includes macular dystrophy OS - no retinal or ophthalmic interventions indicated or recommended at this time  - f/u 6 months, sooner prn - DFE, OCT  3. Mixed Cataract OU - The symptoms of cataract, surgical options, and treatments and risks were discussed with patient. - discussed diagnosis and progression - under the expert care of [facility]  Ophthalmic Meds Ordered this visit:  No orders of the defined types were placed in this encounter.    Return in about 6 months (around 10/10/2023) for non-exu ARMD OU, DFE, OCT.  There are no Patient Instructions on file for this visit.   Explained the diagnoses, plan, and follow up with the patient and they expressed understanding.  Patient expressed understanding of the importance of proper follow up care.   This document serves as a record of services personally performed by Karie Chimera, MD, PhD. It was created on their behalf by Gerilyn Nestle, COT an ophthalmic technician. The creation of this record is the provider's dictation and/or activities during the visit.    Electronically signed by:  Gerilyn Nestle, COT  04.23.24 1:36 AM  This document serves as a record of  services personally performed by Karie Chimera, MD, PhD. It was created on their behalf by Glee Arvin. Manson Passey, OA an ophthalmic technician. The creation of this record is the provider's dictation and/or activities during the visit.    Electronically signed by: Glee Arvin. Manson Passey, [place] 05.07.2024 1:36 AM  Karie Chimera, M.D., Ph.D. Diseases & Surgery of the Retina and Vitreous [facility]  I have reviewed the above documentation for accuracy and completeness, and I agree with the above. Karie Chimera, M.D., Ph.D. 04/13/23 1:41 AM    Abbreviations: M myopia (nearsighted); A astigmatism; H hyperopia (farsighted); P presbyopia; Mrx spectacle prescription;  CTL contact lenses; OD right eye; OS left eye; OU both eyes  XT exotropia; ET esotropia; PEK punctate epithelial keratitis; PEE punctate epithelial erosions; DES dry eye syndrome; MGD meibomian gland dysfunction; ATs artificial tears; PFAT's preservative free artificial tears; NSC nuclear sclerotic cataract; PSC posterior subcapsular cataract; ERM epi-retinal membrane; PVD posterior vitreous detachment; RD retinal detachment; DM diabetes mellitus; DR diabetic retinopathy; NPDR non-proliferative diabetic retinopathy; PDR proliferative diabetic retinopathy; CSME clinically significant macular edema; DME diabetic macular edema; dbh dot blot hemorrhages; CWS cotton wool spot; POAG primary open angle glaucoma; C/D cup-to-disc ratio; HVF humphrey visual field; GVF goldmann visual field; OCT optical coherence tomography; IOP intraocular pressure; BRVO Branch retinal vein occlusion; CRVO central retinal vein occlusion; CRAO central retinal artery occlusion; BRAO branch retinal artery occlusion; RT retinal tear; SB scleral buckle; PPV pars plana vitrectomy; VH Vitreous hemorrhage; PRP panretinal laser photocoagulation; IVK intravitreal kenalog; VMT vitreomacular traction; MH Macular hole;  NVD neovascularization of the disc; NVE  neovascularization elsewhere; AREDS age related eye disease study; ARMD age related macular degeneration; POAG primary open angle glaucoma;  EBMD epithelial/anterior basement membrane dystrophy; ACIOL anterior chamber intraocular lens; IOL intraocular lens; PCIOL posterior chamber intraocular lens; Phaco/IOL phacoemulsification with intraocular lens placement; [place] photorefractive keratectomy; LASIK laser assisted in situ keratomileusis; HTN hypertension; DM diabetes mellitus; COPD chronic obstructive pulmonary disease

## 2023-04-09 ENCOUNTER — Encounter (INDEPENDENT_AMBULATORY_CARE_PROVIDER_SITE_OTHER): Payer: Self-pay | Admitting: Ophthalmology

## 2023-04-09 ENCOUNTER — Ambulatory Visit (INDEPENDENT_AMBULATORY_CARE_PROVIDER_SITE_OTHER): Payer: Medicare Other | Admitting: Ophthalmology

## 2023-04-09 DIAGNOSIS — H353122 Nonexudative age-related macular degeneration, left eye, intermediate dry stage: Secondary | ICD-10-CM | POA: Diagnosis not present

## 2023-04-09 DIAGNOSIS — H353111 Nonexudative age-related macular degeneration, right eye, early dry stage: Secondary | ICD-10-CM | POA: Diagnosis not present

## 2023-04-09 DIAGNOSIS — H25813 Combined forms of age-related cataract, bilateral: Secondary | ICD-10-CM | POA: Diagnosis not present

## 2023-04-13 ENCOUNTER — Encounter (INDEPENDENT_AMBULATORY_CARE_PROVIDER_SITE_OTHER): Payer: Self-pay | Admitting: Ophthalmology

## 2023-04-21 NOTE — Progress Notes (Signed)
[facility] New Patient Evaluation and Consultation  Referring Provider: Merri Brunette, MD PCP: Merri Brunette, MD Date of Service: 04/22/2023  SUBJECTIVE Chief Complaint: No chief complaint on file.  History of Present Illness: REAGIN LABRANCHE is a 67 y.o. {ED SANE [phone] female seen in consultation at the request of Dr. Katrinka Blazing for evaluation of prolapse.    ***Review of records significant for: Ductal carcinoma in situ (DCIS) of right breast   Urinary Symptoms: Does not leak urine.  Leaks *** time(s) per {days/wks/mos/yrs:310907}.  Pad use: {NUMBERS 1-10:18281} {pad option:24752} per day.   She {ACTION; IS/IS WUJ:81191478} bothered by her UI symptoms.  Day time voids 3-4.  Nocturia: 1 times per night to void. Voiding dysfunction: she empties her bladder well.  does not use a catheter to empty bladder.  When urinating, she feels she has no difficulties Drinks: *** per day  UTIs: 0 UTI's in the last year.   Denies history of blood in urine, kidney or bladder stones, pyelonephritis, bladder cancer, and kidney cancer  Pelvic Organ Prolapse Symptoms:                  She Admits to a feeling of a bulge the vaginal area. It has been present for greater 1 years.  She Admits to seeing a bulge.  This bulge is bothersome.  Bowel Symptom: Bowel movements: 1-2 time(s) per day Stool consistency: soft  Straining: no.  Splinting: yes.  Incomplete evacuation: no.  She Denies accidental bowel leakage / fecal incontinence  Occurs: *** time(s) per {Time; day/week/month:13537}  Consistency with leakage: {stool consistency:24758} Bowel regimen: {bowel regimen:24759} Last colonoscopy: Date 2023, Results WNL  Sexual Function Sexually active: yes.  Sexual orientation: Straight Pain with sex: No  Pelvic Pain Denies pelvic pain Location: *** Pain occurs: *** Prior pain treatment: *** Improved by: *** Worsened by: ***   Past Medical History:  Past Medical History:   Diagnosis Date   COVID 12/05/2020     Past Surgical History:   Past Surgical History:  Procedure Laterality Date   ANKLE SURGERY     BREAST BIOPSY Left 2003   BREAST LUMPECTOMY Right 12/22/2020   DCIS   BREAST LUMPECTOMY WITH RADIOACTIVE SEED LOCALIZATION Right 12/22/2020   Procedure: RIGHT BREAST LUMPECTOMY WITH RADIOACTIVE SEED LOCALIZATION;  Surgeon: Emelia Loron, MD;  Location: [facility];  Service: General;  Laterality: Right;   CHOLECYSTECTOMY     WRIST SURGERY       Past OB/GYN History: G{NUMBERS 1-10:18281} P{NUMBERS 1-10:18281} Vaginal deliveries: ***,  Forceps/ Vacuum deliveries: ***, Cesarean section: *** Menopausal: at age 6 Last pap smear was ***.  Any history of abnormal pap smears: yes.   Medications: She has a current medication list which includes the following prescription(s): ascorbic acid, misc natural products, multiple vitamins-minerals, nutritional supplements, and tamoxifen.   Allergies: Patient is allergic to penicillins.   Social History:  Social History   Tobacco Use   Smoking status: Never   Smokeless tobacco: Never  Vaping Use   Vaping Use: Never used  Substance Use Topics   Alcohol use: Yes    Comment: "couple glasses of wine a day"   Drug use: No    Relationship status: married She lives with husband.   She is employed as a Veterinary surgeon. Regular exercise: Yes: *** History of abuse: No  Family History:   Family History  Problem Relation Age of Onset   Breast cancer Maternal Aunt      Review of  Systems: ROS   OBJECTIVE Physical Exam: There were no vitals filed for this visit.  Physical Exam   GU / Detailed Urogynecologic Evaluation:  Pelvic Exam: Normal external female genitalia; Bartholin's and Skene's glands normal in appearance; urethral meatus normal in appearance, no urethral masses or discharge.   CST: {gen negative/positive:315881}  Reflexes: bulbocavernosis {DESC; PRESENT/NOT  PRESENT:21021351}, anocutaneous {DESC; PRESENT/NOT PRESENT:21021351} ***bilaterally.  Speculum exam reveals normal vaginal mucosa {With/Without:20273} atrophy. Cervix {exam; gyn cervix:30847}. Uterus {exam; pelvic uterus:30849}. Adnexa {exam; adnexa:12223}.    s/p hysterectomy: Speculum exam reveals normal vaginal mucosa {With/Without:20273}  atrophy and normal vaginal cuff.  Adnexa {exam; adnexa:12223}.    With apex supported, anterior compartment defect was {reduced:24765}  Pelvic floor strength {Roman # I-V:19040}/V, puborectalis {Roman # I-V:19040}/V external anal sphincter {Roman # I-V:19040}/V  Pelvic floor musculature: Right levator {Tender/Non-tender:20250}, Right obturator {Tender/Non-tender:20250}, Left levator {Tender/Non-tender:20250}, Left obturator {Tender/Non-tender:20250}  POP-Q:   POP-Q                                               Aa                                               Ba                                                 C                                                Gh                                               Pb                                               tvl                                                Ap                                               Bp                                                 D      Rectal Exam:  Normal sphincter tone, {rectocele:24766} distal rectocele,  enterocoele {DESC; PRESENT/NOT PRESENT:21021351}, no rectal masses, {sign of:24767} dyssynergia when asking the patient to bear down.  Post-Void Residual (PVR) by Bladder Scan: In order to evaluate bladder emptying, we discussed obtaining a postvoid residual and she agreed to this procedure.  Procedure: The ultrasound unit was placed on the patient's abdomen in the suprapubic region after the patient had voided. A PVR of *** ml was obtained by bladder scan.  Laboratory Results: @ENCLABS @   ***I visualized the urine specimen, noting the specimen to be  {urine color:24768}  ASSESSMENT AND PLAN Ms. Biener is a 67 y.o. with: No diagnosis found.    Selmer Dominion, NP   Medical Decision Making:  - Reviewed/ ordered a clinical laboratory test - Reviewed/ ordered a radiologic study - Reviewed/ ordered medicine test - Decision to obtain old records - Discussion of management of or test interpretation with an external physician / other healthcare professional  - Assessment requiring independent historian - Review and summation of prior records - Independent review of image, tracing or specimen

## 2023-04-22 ENCOUNTER — Ambulatory Visit: Payer: Medicare Other | Admitting: Obstetrics and Gynecology

## 2023-04-22 ENCOUNTER — Other Ambulatory Visit (HOSPITAL_COMMUNITY)
Admission: RE | Admit: 2023-04-22 | Discharge: 2023-04-22 | Disposition: A | Discharge status: Home / Self Care | Payer: Medicare Other | Source: Other Acute Inpatient Hospital | Attending: Obstetrics and Gynecology | Admitting: Obstetrics and Gynecology

## 2023-04-22 ENCOUNTER — Encounter: Payer: Self-pay | Admitting: Obstetrics and Gynecology

## 2023-04-22 VITALS — BP 119/76 | HR 67 | Ht 66.4 in | Wt 184.4 lb

## 2023-04-22 DIAGNOSIS — M6289 Other specified disorders of muscle: Secondary | ICD-10-CM

## 2023-04-22 DIAGNOSIS — N814 Uterovaginal prolapse, unspecified: Secondary | ICD-10-CM | POA: Diagnosis not present

## 2023-04-22 DIAGNOSIS — R350 Frequency of micturition: Secondary | ICD-10-CM | POA: Diagnosis not present

## 2023-04-22 DIAGNOSIS — N952 Postmenopausal atrophic vaginitis: Secondary | ICD-10-CM

## 2023-04-22 DIAGNOSIS — R82998 Other abnormal findings in urine: Secondary | ICD-10-CM | POA: Diagnosis present

## 2023-04-22 LAB — POCT URINALYSIS DIPSTICK
Bilirubin, UA: NEGATIVE
Glucose, UA: NEGATIVE
Ketones, UA: NEGATIVE
Nitrite, UA: NEGATIVE
Protein, UA: NEGATIVE
Spec Grav, UA: >=1.03 — AB (ref 1.010–1.025)
Urobilinogen, UA: 0.2 E.U./dL
pH, UA: 5.5 (ref 5.0–8.0)

## 2023-04-22 MED ORDER — ESTRADIOL 0.1 MG/GM VA CREA: 0.5000 g | TOPICAL_CREAM | VAGINAL | 11 refills | Status: DC

## 2023-04-22 NOTE — Patient Instructions (Signed)
You have stage 2 of 4 Uterine prolapse with the uterus coming a little over halfway down the vaginal length.   Start Physical therapy and vaginal estrogen cream.   There are surgical options if you choose to go that direction.

## 2023-04-23 LAB — URINE CULTURE: Culture: NO GROWTH

## 2023-04-24 NOTE — Therapy (Unsigned)
OUTPATIENT PHYSICAL THERAPY FEMALE PELVIC EVALUATION   Patient Name: Cindy Roman MRN: [id number] DOB:12/08/1955, 67 y.o., female Today's Date: 04/25/2023  END OF SESSION:  PT End of Session - 04/25/23 1609     Visit Number 1    Date for PT Re-Evaluation 07/18/23    Authorization Type Medicare    Authorization - Visit Number 1    Authorization - Number of Visits 10    PT Start Time 1600    PT Stop Time 1645    PT Time Calculation (min) 45 min    Activity Tolerance Patient tolerated treatment well    Behavior During Therapy [facility] for tasks assessed/performed             Past Medical History:  Diagnosis Date   Breast cancer (HCC) 2021   COVID 12/05/2020   Past Surgical History:  Procedure Laterality Date   ANKLE SURGERY     BREAST BIOPSY Left 2003   BREAST LUMPECTOMY Right 12/22/2020   DCIS   BREAST LUMPECTOMY WITH RADIOACTIVE SEED LOCALIZATION Right 12/22/2020   Procedure: RIGHT BREAST LUMPECTOMY WITH RADIOACTIVE SEED LOCALIZATION;  Surgeon: Emelia Loron, MD;  Location: [facility];  Service: General;  Laterality: Right;   CHOLECYSTECTOMY     WRIST SURGERY     Patient Active Problem List   Diagnosis Date Noted   Genital herpes simplex 06/08/2021   Ductal carcinoma in situ (DCIS) of right breast 10/12/2020   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   Left knee pain 04/24/2018   Toe ulcer, left, limited to breakdown of skin (HCC) 04/24/2018   Unilateral primary osteoarthritis, right hip 05/20/2017   Unilateral primary osteoarthritis, right knee 05/20/2017    PCP: Merri Brunette, MD  REFERRING PROVIDER: Selmer Dominion, NP   REFERRING DIAG: N81.4 (ICD-10-CM) - Uterine prolapse  THERAPY DIAG:  Muscle weakness (generalized)  Rationale for Evaluation and Treatment: Rehabilitation  ONSET DATE: 2022  SUBJECTIVE:                                                                                                                                                                                            SUBJECTIVE STATEMENT: Patient felt a bulge in vaginal area several years ago. Patient is taking the Tamoxifen. Patient is doing an app for exercise but not sure about the exercise. Fluid intake: Drinks: Water, 1 diet coke per day, and Tea per day    PAIN:  Are you having pain? No  PRECAUTIONS: Other: breast cancer  WEIGHT BEARING RESTRICTIONS: No  FALLS:  Has patient fallen in last 6 months? No  LIVING ENVIRONMENT: Lives with: lives with  their spouse   OCCUPATION: real estate, walks on treadmill at gym, lift weights, yoga  PLOF: Independent  PATIENT GOALS: reduce the prolapse and strengthen the pelvic floor  PERTINENT HISTORY:  Ductal carcinoma in situ (DCIS) of right breast: Is in a study in which she did not have radiation, only had surgery and Tomoxifen ; cholecystectomy    BOWEL MOVEMENT: strain every once in awhile.   URINATION: Pain with urination: No Fully empty bladder: Yes:   Stream: Strong Urgency: No Frequency: Day time voids 3-4.  Nocturia: 1 times per night to void.  Leakage:  none   INTERCOURSE: Pain with intercourse:  no pain , aware of the prolapse with intercourse Ability to have vaginal penetration:  Yes:   Climax: yes Marinoff Scale: 0/3  PREGNANCY: Vaginal deliveries 2 Tearing Yes: some stitches C-section deliveries 1 Currently pregnant No  PROLAPSE: Stage I/IV Anterior, II/IV Uterine, and I/V posterior prolapse.    OBJECTIVE:   DIAGNOSTIC FINDINGS:  Pelvic floor strength II/V, puborectalis  Pelvic floor musculature: Right levator minimally tender, Right obturator mildly tender, Left levator non-tender, Left obturator non-tender PVR of 3 ml was obtained by bladder scan.      COGNITION: Overall cognitive status: Within functional limits for tasks assessed     SENSATION: Light touch: Appears intact Proprioception: Appears intact   POSTURE: No Significant  postural limitations  PELVIC ALIGNMENT:  LUMBARAROM/PROM: Full lumbar ROM   LOWER EXTREMITY ROM:  Passive ROM Right eval Left eval  Hip external rotation 50 30   (Blank rows = not tested)  LOWER EXTREMITY MMT:  MMT Right eval Left eval  Hip extension 4/5 4/5   PALPATION:   General  restrictions of the c-section scar, decreased contraction of the lower abdominals                External Perineal Exam intact                             Internal Pelvic Floor tightness along the sides of the introitus and levator ani; tightness on the right side of the bladder, good mobility of the cervix  Patient confirms identification and approves PT to assess internal pelvic floor and treatment Yes  PELVIC MMT:   MMT eval  Vaginal 2/5 initially then went to 3/5 with verbal cues and manual work to the sides of the introitus  (Blank rows = not tested)        TONE: average  PROLAPSE: Anterior and posterior wall weakness,   TODAY'S TREATMENT:                                                                                                                              DATE: 04/25/23  EVAL See below   PATIENT EDUCATION:  04/25/23 Education details:  [email or web address], gave patient information on prolapse management, what prolapses are and information on pessaries, educate patient on how  to place the cream in the vaginal canal and rub around the introitus.  Person educated: Patient Education method: Explanation, Demonstration, Tactile cues, Verbal cues, and Handouts Education comprehension: verbalized understanding, returned demonstration, verbal cues required, tactile cues required, and needs further education  HOME EXERCISE PROGRAM: 04/25/23 Access Code: [email or web address] URL: [email or web address] Date: 04/25/2023 Prepared by: Eulis Twyford  Exercises - Supine Pelvic Floor Contraction  - 3 x daily - 7 x weekly - 1 sets - 10 reps  ASSESSMENT:  CLINICAL IMPRESSION: Patient is a 67  y.o. female who was seen today for physical therapy evaluation and treatment for uterine prolapse. Patient has noticed a vaginal bulge for several years. She has a history of right breast cancer with radiation and surgery. She is presently taking Tamoxifen. Patient reports not urinary leakage. Pelvic floor strength is 2/5. She has tightness in the introitus, right side of the perineal body and levator ani.  She has Stage I/IV Anterior, II/IV Uterine, and I/V posterior prolapse. She has difficulty with fully opening her lower rib cage. She does not contract her lower abdominals well. Restrictions in the c-section scar.  Patient would benefit from skilled therapy to improve pelvic floor contraction and understand ways to manage prolapse.   OBJECTIVE IMPAIRMENTS: decreased coordination, decreased strength, increased fascial restrictions, and increased muscle spasms.   ACTIVITY LIMITATIONS: lifting  PARTICIPATION LIMITATIONS: shopping and community activity  PERSONAL FACTORS: 1-2 comorbidities: Ductal carcinoma in situ (DCIS) of right breast: Is in a study in which she did not have radiation, only had surgery and Tomoxifen ; cholecystectomy  are also affecting patient's functional outcome.   REHAB POTENTIAL: Excellent  CLINICAL DECISION MAKING: Stable/uncomplicated  EVALUATION COMPLEXITY: Low   GOALS: Goals reviewed with patient? Yes  SHORT TERM GOALS: Target date: 05/21/23  Patient understands ways to manage prolapse.  Baseline: Goal status: INITIAL   LONG TERM GOALS: Target date: 07/18/23  Patient independent with advanced HEP for core and pelvic floor strength.  Baseline:  Goal status: INITIAL  2.  Patient understands correct ways to toilet to reduce straining and pressure on the pelvic floor.  Baseline:  Goal status: INITIAL  3.  Pelvic floor strength is 3/5 holding for 10 seconds so she is able to support her prolapse better.  Baseline:  Goal status: INITIAL  4.  Patient  understands how to exercise at the gym correctly to reduce strain on prolapse.  Baseline:  Goal status: INITIAL  5.  Patient is able to lift 10# with correct technique and keep distance between the rib cage and pubic bone.  Baseline:  Goal status: INITIAL   PLAN:  PT FREQUENCY: 1x/week  PT DURATION: 12 weeks  PLANNED INTERVENTIONS: Therapeutic exercises, Therapeutic activity, Neuromuscular re-education, Patient/Family education, Electrical stimulation, scar mobilization, Biofeedback, and Manual therapy  PLAN FOR NEXT SESSION: manual work to the pelvic floor to improve pelvic floor contraction, prolapse management, start prolapse exercise   Eulis Bevins, PT 04/25/23 5:22 PM

## 2023-04-25 ENCOUNTER — Encounter: Payer: Medicare Other | Attending: Obstetrics and Gynecology | Admitting: Physical Therapy

## 2023-04-25 ENCOUNTER — Encounter: Payer: Self-pay | Admitting: Physical Therapy

## 2023-04-25 DIAGNOSIS — M6281 Muscle weakness (generalized): Secondary | ICD-10-CM | POA: Diagnosis present

## 2023-04-29 NOTE — Progress Notes (Signed)
Patient Care Team: Merri Brunette, MD as PCP - General (Family Medicine) Pershing Proud, RN as Oncology Nurse Navigator Donnelly Angelica, RN as Oncology Nurse Navigator  DIAGNOSIS:  Encounter Diagnosis  Name Primary?   Ductal carcinoma in situ (DCIS) of right breast Yes    SUMMARY OF ONCOLOGIC HISTORY: Oncology History  Ductal carcinoma in situ (DCIS) of right breast  10/12/2020 Initial Diagnosis   Screening mammogram showed right breast calcifications, spanning 2.0cm. 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    10/12/2020 Cancer Staging   Staging form: Breast, AJCC 8th Edition - Clinical stage from 10/12/2020: Stage 0 (cTis (DCIS), cN0, cM0, ER+, PR+) - Signed by Loa Socks, NP on 10/12/2020   12/22/2020 Surgery   Right lumpectomy Dwain Sarna): intermediate grade DCIS, 1.2cm, clear margins (COMET clinical trial)     CHIEF COMPLIANT:  Follow-up of right breast DCIS on Comet clinical trial on tamoxifen    INTERVAL HISTORY: Cindy Roman is a 67 y.o. with above-mentioned history of right breast DCIS. She presents to the clinic today for follow-up. She reports that she is doing ok on the tamoxifen. She has had muscle cramps and joint pain. Hot flashes every once in a while. She says she is sleeping well, except when the leg cramps wakes her up.   ALLERGIES:  is allergic to penicillins.  MEDICATIONS:  Current Outpatient Medications  Medication Sig Dispense Refill   estradiol (ESTRACE) 0.1 MG/GM vaginal cream Place 0.5 g vaginally 2 (two) times a week. Place 0.5g nightly for two weeks then twice a week after 42.5 g 11   Misc Natural Products (DAILY HERBS IMMUNE DEFENSE PO) Take by mouth.     Multiple Vitamins-Minerals (PRESERVISION AREDS PO) Take by mouth.     Nutritional Supplements (JUICE PLUS FIBRE PO) Take by mouth.     tamoxifen (NOLVADEX) 20 MG tablet Take 1 tablet (20 mg total) by mouth daily. 90  tablet 3   No current facility-administered medications for this visit.    PHYSICAL EXAMINATION: ECOG PERFORMANCE STATUS: 1 - Symptomatic but completely ambulatory  Vitals:   05/10/23 0821  BP: 136/67  Pulse: 76  Resp: 18  Temp: 98.1 F (36.7 C)  SpO2: 100%   Filed Weights   05/10/23 0821  Weight: 187 lb (84.8 kg)    BREAST: No palpable masses or nodules in either right or left breasts. No palpable axillary supraclavicular or infraclavicular adenopathy no breast tenderness or nipple discharge. (exam performed in the presence of a chaperone)  LABORATORY DATA:  I have reviewed the data as listed    Latest Ref Rng & Units 05/03/2009    8:50 AM  CMP  Glucose 70 - 99 mg/dL 161   BUN 6 - 23 mg/dL 11   Creatinine 0.4 - 1.2 mg/dL 0.96   Sodium 045 - 409 mEq/L 138   Potassium 3.5 - 5.1 mEq/L 4.8   Chloride 96 - 112 mEq/L 105   CO2 19 - 32 mEq/L 27   Calcium 8.4 - 10.5 mg/dL 9.4   Total Protein 6.0 - 8.3 g/dL 6.7   Total Bilirubin 0.3 - 1.2 mg/dL 0.7   Alkaline Phos 39 - 117 U/L 69   AST 0 - 37 U/L 22   ALT 0 - 35 U/L 25     Lab Results  Component Value Date   WBC 5.7 05/03/2009   HGB 12.9 05/03/2009   HCT 38.5 05/03/2009  MCV 93.4 05/03/2009   PLT 174 05/03/2009   NEUTROABS 3.0 05/03/2009    ASSESSMENT & PLAN:  Ductal carcinoma in situ (DCIS) of right breast 12/22/2020: Right lumpectomy Dwain Sarna): Intermediate grade DCIS, 1.2 cm, clear margins, ER 95%, PR 40% Tis NX stage 0   COMET clinical trial: Randomized to surgery   Treatment plan: 1. Patient refused adjuvant radiation 2. Adjuvant antiestrogen therapy with tamoxifen x5 years   Tamoxifen toxicities:  Intermittent muscle cramps that wake her up at night: Mild  Slight fatigue: Possibly related to her thyroid function.  Being evaluated by her primary care physician. Abnormal Pap smear: Was prescribed estrogen cream.    Breast cancer surveillance: Mammogram 11/05/2022: Benign Density Cat B Breast exam  05/10/2023: Benign  Patient's daughter lives in [place]  Return to clinic in 6 months for follow-up    No orders of the defined types were placed in this encounter.  The patient has a good understanding of the overall plan. she agrees with it. she will call with any problems that may develop before the next visit here. Total time spent: 30 mins including face to face time and time spent for planning, charting and co-ordination of care   Tamsen Meek, MD 05/10/23    I Janan Ridge am acting as a Neurosurgeon for [employer]  I have reviewed the above documentation for accuracy and completeness, and I agree with the above.

## 2023-05-08 ENCOUNTER — Telehealth: Payer: Self-pay | Admitting: *Deleted

## 2023-05-08 NOTE — Telephone Encounter (Signed)
AFT - 25: COMPARING AN OPERATION TO MONITORING, WITH OR WITHOUT ENDOCRINE THERAPY (COMET) FOR LOW RISK DCIS: A PHASE III PROSPECTIVE RANDOMIZED TRIAL    30- month study visit phone call   The research nurse called the pt to check on her health status.  The nurse will not be available to see the pt on Friday 05/10/23, and wanted to go over her health status today in advance of her upcoming appt.   The nurse reviewed the solicited AE table with the pt.  She confirmed that she is still taking tamoxifen daily.    Research labs - No required month 30 research labs are due at this visit.    Mammogram: Imaging is not required at this visit.  Pt's next imaging is due in December 2024.    Solicited and Other/Adverse Events: Reviewed with patient and documented below. Patient with no hospital admissions and no SAE's.     Questionnaires:  No questionnaires are due at this visit.     Cindy Roman [id number]   05/08/2023   Solicited and Other/Adverse Events: Reviewed with patient and documented below. Patient with no hospital admissions and no SAE's. The pt said that she has developed some thyroid issues which she does not know if it is related to her tamoxifen.  The pt also mentioned some abnormal pap smears which she is being seen on a regular basis by Dr. Marcelle Overlie for this condition.  The pt plans to discuss both of these health concerns with Dr. Pamelia Hoit on Friday.    Study/Protocol: AFT-25/COMET  Cycle: 30 month visit  Research nurse will obtain attributions from Dr. Pamelia Hoit after the pt's visit on 05/10/23 once MD has evaluated the pt. The pt confirmed that she still has mild arthralgia and myalgia, mainly in the form of leg cramps.  The pt said that it does affect her sleep at times.  Pt said her myalgia was "moderate" in nature.  (Grade 2) Event Grade Onset Date Resolved Date Drug Name Attribution Treatment Comments  Arthralgia  Grade 1 11/12/22 Ongoing  Tamoxifen   None reported  Mainly "wrist"  discomfort r/t yoga/exercise  Myalgia  Grade 2 11/12/22 Ongoing  Tamoxifen  None reported  "Mild muscle cramps"   Not evaluated Solicited AE's include the following:  acute coronary syndrome, ischemia cerebrovascular, osteoporosis, cholesterol Pt denied the following Solicited AE's:  allergic reaction, fever, hot flashes, hypertension, nausea, and fracture  Plan: Mrs. Llorente will be scheduled to return in approximately 6 months for MD visit in December 2024.  I thanked patient for her continued participation and support of study.    Janan Ridge RN, BSN, CCRP Clinical Research Nurse Lead 05/08/2023 2:05 PM

## 2023-05-09 ENCOUNTER — Telehealth: Payer: Self-pay | Admitting: Physical Therapy

## 2023-05-09 ENCOUNTER — Encounter: Payer: Medicare Other | Attending: Obstetrics and Gynecology | Admitting: Physical Therapy

## 2023-05-09 DIAGNOSIS — M6281 Muscle weakness (generalized): Secondary | ICD-10-CM | POA: Insufficient documentation

## 2023-05-09 NOTE — Telephone Encounter (Signed)
Called patient about her missed appointment today at 16:15. She was caught up in work. More appts. Made for June.  Eulis Beltre, PT @6 /6/24@ 4:30 PM

## 2023-05-10 ENCOUNTER — Other Ambulatory Visit: Payer: Self-pay

## 2023-05-10 ENCOUNTER — Encounter: Payer: Self-pay | Admitting: Hematology and Oncology

## 2023-05-10 ENCOUNTER — Inpatient Hospital Stay: Payer: Medicare Other | Attending: Hematology and Oncology | Admitting: Hematology and Oncology

## 2023-05-10 ENCOUNTER — Other Ambulatory Visit: Payer: No Typology Code available for payment source

## 2023-05-10 VITALS — BP 136/67 | HR 76 | Temp 98.1°F | Resp 18 | Wt 187.0 lb

## 2023-05-10 DIAGNOSIS — R252 Cramp and spasm: Secondary | ICD-10-CM | POA: Diagnosis not present

## 2023-05-10 DIAGNOSIS — D0511 Intraductal carcinoma in situ of right breast: Secondary | ICD-10-CM | POA: Insufficient documentation

## 2023-05-10 DIAGNOSIS — Z79810 Long term (current) use of selective estrogen receptor modulators (SERMs): Secondary | ICD-10-CM | POA: Insufficient documentation

## 2023-05-10 DIAGNOSIS — Z006 Encounter for examination for normal comparison and control in clinical research program: Secondary | ICD-10-CM

## 2023-05-10 NOTE — Assessment & Plan Note (Addendum)
12/22/2020: Right lumpectomy Dwain Sarna): Intermediate grade DCIS, 1.2 cm, clear margins, ER 95%, PR 40% Tis NX stage 0   COMET clinical trial: Randomized to surgery   Treatment plan: 1. Patient refused adjuvant radiation 2. Adjuvant antiestrogen therapy with tamoxifen x5 years   Tamoxifen toxicities:  Intermittent muscle cramps that wake her up at night: Mild  Slight fatigue: Possibly related to her thyroid function.  Being evaluated by her primary care physician. Abnormal Pap smear: Was prescribed estrogen cream.    Breast cancer surveillance: Mammogram 11/05/2022: Benign Density Cat B Breast exam 05/10/2023: Benign  Patient's daughter lives in [place]  Return to clinic in 6 months for follow-up

## 2023-05-13 ENCOUNTER — Other Ambulatory Visit: Payer: Self-pay | Admitting: *Deleted

## 2023-05-13 DIAGNOSIS — D0511 Intraductal carcinoma in situ of right breast: Secondary | ICD-10-CM

## 2023-05-15 ENCOUNTER — Ambulatory Visit: Payer: No Typology Code available for payment source | Admitting: Hematology and Oncology

## 2023-05-15 ENCOUNTER — Other Ambulatory Visit: Payer: No Typology Code available for payment source

## 2023-05-16 ENCOUNTER — Encounter: Payer: Medicare Other | Admitting: Physical Therapy

## 2023-05-16 ENCOUNTER — Encounter: Payer: Self-pay | Admitting: Physical Therapy

## 2023-05-16 DIAGNOSIS — M6281 Muscle weakness (generalized): Secondary | ICD-10-CM

## 2023-05-16 NOTE — Therapy (Signed)
OUTPATIENT PHYSICAL THERAPY FEMALE PELVIC TREATMENT  Patient Name: Cindy Roman MRN: [id number] DOB:06-Aug-1956, 67 y.o., female Today's Date: 05/16/2023  END OF SESSION:  PT End of Session - 05/16/23 0935     Visit Number 2    Date for PT Re-Evaluation 07/18/23    Authorization Type Medicare    Authorization - Visit Number 2    Authorization - Number of Visits 10    PT Start Time 0930    PT Stop Time 1015    PT Time Calculation (min) 45 min    Activity Tolerance Patient tolerated treatment well    Behavior During Therapy [facility] for tasks assessed/performed             Past Medical History:  Diagnosis Date   Breast cancer (HCC) 2021   COVID 12/05/2020   Past Surgical History:  Procedure Laterality Date   ANKLE SURGERY     BREAST BIOPSY Left 2003   BREAST LUMPECTOMY Right 12/22/2020   DCIS   BREAST LUMPECTOMY WITH RADIOACTIVE SEED LOCALIZATION Right 12/22/2020   Procedure: RIGHT BREAST LUMPECTOMY WITH RADIOACTIVE SEED LOCALIZATION;  Surgeon: Emelia Loron, MD;  Location: [facility];  Service: General;  Laterality: Right;   CHOLECYSTECTOMY     WRIST SURGERY     Patient Active Problem List   Diagnosis Date Noted   Genital herpes simplex 06/08/2021   Ductal carcinoma in situ (DCIS) of right breast 10/12/2020   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   Left knee pain 04/24/2018   Toe ulcer, left, limited to breakdown of skin (HCC) 04/24/2018   Unilateral primary osteoarthritis, right hip 05/20/2017   Unilateral primary osteoarthritis, right knee 05/20/2017    PCP: Merri Brunette, MD  REFERRING PROVIDER: Selmer Dominion, NP   REFERRING DIAG: N81.4 (ICD-10-CM) - Uterine prolapse  THERAPY DIAG:  Muscle weakness (generalized)  Rationale for Evaluation and Treatment: Rehabilitation  ONSET DATE: 2022  SUBJECTIVE:                                                                                                                                                                                            SUBJECTIVE STATEMENT: I have trouble with placing the cream in the vaginal canal.  PAIN:  Are you having pain? No  PRECAUTIONS: Other: breast cancer  WEIGHT BEARING RESTRICTIONS: No  FALLS:  Has patient fallen in last 6 months? No  LIVING ENVIRONMENT: Lives with: lives with their spouse   OCCUPATION: real estate, walks on treadmill at gym, lift weights, yoga  PLOF: Independent  PATIENT GOALS: reduce the prolapse and strengthen the pelvic floor  PERTINENT HISTORY:  Ductal carcinoma in situ (DCIS) of right breast: Is in a study in which she did not have radiation, only had surgery and Tomoxifen ; cholecystectomy    BOWEL MOVEMENT: strain every once in awhile.   URINATION: Pain with urination: No Fully empty bladder: Yes:   Stream: Strong Urgency: No Frequency: Day time voids 3-4.  Nocturia: 1 times per night to void.  Leakage:  none   INTERCOURSE: Pain with intercourse:  no pain , aware of the prolapse with intercourse Ability to have vaginal penetration:  Yes:   Climax: yes Marinoff Scale: 0/3  PREGNANCY: Vaginal deliveries 2 Tearing Yes: some stitches C-section deliveries 1 Currently pregnant No  PROLAPSE: Stage I/IV Anterior, II/IV Uterine, and I/V posterior prolapse.    OBJECTIVE:   DIAGNOSTIC FINDINGS:  Pelvic floor strength II/V, puborectalis  Pelvic floor musculature: Right levator minimally tender, Right obturator mildly tender, Left levator non-tender, Left obturator non-tender PVR of 3 ml was obtained by bladder scan.      COGNITION: Overall cognitive status: Within functional limits for tasks assessed     SENSATION: Light touch: Appears intact Proprioception: Appears intact   POSTURE: No Significant postural limitations  PELVIC ALIGNMENT:  LUMBARAROM/PROM: Full lumbar ROM   LOWER EXTREMITY ROM:  Passive ROM Right eval Left eval  Hip external rotation 50 30    (Blank rows = not tested)  LOWER EXTREMITY MMT:  MMT Right eval Left eval  Hip extension 4/5 4/5   PALPATION:   General  restrictions of the c-section scar, decreased contraction of the lower abdominals                External Perineal Exam intact                             Internal Pelvic Floor tightness along the sides of the introitus and levator ani; tightness on the right side of the bladder, good mobility of the cervix  Patient confirms identification and approves PT to assess internal pelvic floor and treatment Yes  PELVIC MMT:   MMT eval 05/16/23  Vaginal 2/5 initially then went to 3/5 with verbal cues and manual work to the sides of the introitus 3/5 with circular hug and lift  (Blank rows = not tested)        TONE: average  PROLAPSE: Anterior and posterior wall weakness,   TODAY'S TREATMENT:    05/16/23 Manual: Internal pelvic floor techniques: No emotional/communication barriers or cognitive limitation. Patient is motivated to learn. Patient understands and agrees with treatment goals and plan. PT explains patient will be examined in standing, sitting, and lying down to see how their muscles and joints work. When they are ready, they will be asked to remove their underwear so PT can examine their perineum. The patient is also given the option of providing their own chaperone as one is not provided in our facility. The patient also has the right and is explained the right to defer or refuse any part of the evaluation or treatment including the internal exam. With the patient's consent, PT will use one gloved finger to gently assess the muscles of the pelvic floor, seeing how well it contracts and relaxes and if there is muscle symmetry. After, the patient will get dressed and PT and patient will discuss exam findings and plan of care. PT and patient discuss plan of care, schedule, attendance policy and HEP activities.  Going through the vaginal canal working on  the  introitus, levator ani, perineal body, release along the left side of the uterus and bladder Urogenital diaphragm release externally going through the different fascial levels Neuromuscular re-education: Pelvic floor contraction training: Therapist index finger in the vaginal canal working on contraction with lift and giving tapping cues to muscles Supine with legs elevated contraction of pelvic floor 10 x hold 5 sec Supine legs elevated with ball squeeze and pelvic floor contraction 10 x Educated patient on different pelvic biofeedback units for home use to work on pelvic floor contraction Exercises: Stretches/mobility: Strengthening: Therapeutic activities: Functional strengthening activities:                                                                                                                            DATE: 04/25/23  EVAL See below   PATIENT EDUCATION:  05/16/23 Education details:  [email or web address], gave patient information on prolapse management, what prolapses are and information on pessaries, educate patient on how to place the cream in the vaginal canal and rub around the introitus.  Person educated: Patient Education method: Explanation, Demonstration, Tactile cues, Verbal cues, and Handouts Education comprehension: verbalized understanding, returned demonstration, verbal cues required, tactile cues required, and needs further education  HOME EXERCISE PROGRAM: 05/16/23 Access Code: [email or web address] URL: [email or web address] Date: 05/16/2023 Prepared by: Eulis Strebel  Exercises - Pelvic Floor Muscle Contraction and Pelvic Tilt to Bridge With Hips Elevated (for Pelvic Organ Prolapse)  - 1 x daily - 7 x weekly - 1 sets - 10 reps - 5  sec hold - Pelvic Floor Muscle Contraction and Adductor Squeeze With Hips Elevated (for Pelvic Organ Prolapse)  - 1 x daily - 7 x weekly - 3 sets - 10 reps  ASSESSMENT:  CLINICAL IMPRESSION: Patient is a 67 y.o. female who was seen  today for physical therapy  treatment for uterine prolapse. Pelvic floor strength increased to 3/5 with good circular hug and lift of the therapist finger.   She had some fascial tightness along the left side of the bladder and uterus. Patient did have some bleeding vaginal after the manual work due to the thinness of her tissue. Patient would benefit from skilled therapy to improve pelvic floor contraction and understand ways to manage prolapse.   OBJECTIVE IMPAIRMENTS: decreased coordination, decreased strength, increased fascial restrictions, and increased muscle spasms.   ACTIVITY LIMITATIONS: lifting  PARTICIPATION LIMITATIONS: shopping and community activity  PERSONAL FACTORS: 1-2 comorbidities: Ductal carcinoma in situ (DCIS) of right breast: Is in a study in which she did not have radiation, only had surgery and Tomoxifen ; cholecystectomy  are also affecting patient's functional outcome.   REHAB POTENTIAL: Excellent  CLINICAL DECISION MAKING: Stable/uncomplicated  EVALUATION COMPLEXITY: Low   GOALS: Goals reviewed with patient? Yes  SHORT TERM GOALS: Target date: 05/21/23  Patient understands ways to manage prolapse.  Baseline: Goal status: INITIAL   LONG TERM GOALS: Target date: 07/18/23  Patient independent with advanced HEP for core and pelvic floor strength.  Baseline:  Goal status: INITIAL  2.  Patient understands correct ways to toilet to reduce straining and pressure on the pelvic floor.  Baseline:  Goal status: INITIAL  3.  Pelvic floor strength is 3/5 holding for 10 seconds so she is able to support her prolapse better.  Baseline:  Goal status: INITIAL  4.  Patient understands how to exercise at the gym correctly to reduce strain on prolapse.  Baseline:  Goal status: INITIAL  5.  Patient is able to lift 10# with correct technique and keep distance between the rib cage and pubic bone.  Baseline:  Goal status: INITIAL   PLAN:  PT FREQUENCY:  1x/week  PT DURATION: 12 weeks  PLANNED INTERVENTIONS: Therapeutic exercises, Therapeutic activity, Neuromuscular re-education, Patient/Family education, Electrical stimulation, scar mobilization, Biofeedback, and Manual therapy  PLAN FOR NEXT SESSION: prolapse management, start prolapse exercise, work on hip extension   Eulis Calixte, PT 05/16/23 10:34 AM

## 2023-05-30 ENCOUNTER — Encounter: Payer: Medicare Other | Admitting: Physical Therapy

## 2023-06-11 ENCOUNTER — Encounter: Payer: Self-pay | Admitting: Physical Therapy

## 2023-06-11 ENCOUNTER — Encounter: Payer: Medicare Other | Attending: Obstetrics and Gynecology | Admitting: Physical Therapy

## 2023-06-11 DIAGNOSIS — M6281 Muscle weakness (generalized): Secondary | ICD-10-CM | POA: Diagnosis present

## 2023-06-11 NOTE — Patient Instructions (Addendum)
The first picture shows that there is no effect on the pelvic floor with gravity eliminated. The next three show that with a wedge pillow or a few pillows from home under your pelvis the pelvic floor is inverted and may relax and allows gravity to help return prolapsed areas more inward to help relieve symptoms. Do this 15-20 mins every evening when symptoms tend to be worse. Stop if you have pain or negative symptoms.   About Pelvic Support Problems Pelvic Support Problems Explained Ligaments, muscles, and connective tissue normally hold your bladder, uterus, and other organs in their proper places in your pelvis. When these tissues become weak, a problem with pelvic support may result. Weak support can cause one or more of the pelvic organs to drop down into the vagina. An organ may even drop so far that is partially exposed outside the body.  Pelvic support problems are named by the change in the organ. The main types of pelvic support problems are:  Cystocele: When the bladder drops down into your vagina.  Enterocele: When your small intestine drops between your vagina and rectum.  Rectocele: When your rectum bulges into the vaginal wall.  Uterine prolapse: When your uterus drops into your vagina.  Vaginal prolapse: When the top part of the vagina begins to droop. This sometimes happens after a hysterectomy (removal of the uterus).  Causes Pelvic support problems can be caused by many conditions. They may begin after you give birth, especially if you had a large baby. During childbirth, the muscles and skin of the birth canal (vagina) are stretched and sometimes torn. They heal over time but are not always exactly the same. A long pushing stage of labor may also weaken these tissues as well as very rapid births as the tissues do not have time to stretch so they tear.  Also, after menopause, there are changes in the vaginal walls resulting from a decrease in estrogen. Estrogen helps to keep the  tissues toned. Low levels of estrogen weaken the vaginal walls and may cause the bladder to shift from its normal position. As women get older, the loss of muscle tone and the relaxation of muscles may cause the uterus or other organs to drop.  Over time, conditions like chronic coughing, chronic constipation, doing a lot of heavy lifting, straining to pass stool, and obesity, can also weaken the pelvic support muscles.  Diagnosing Pelvic Support Problems Your health care provider will ask about your symptoms and do a pelvic examination. Your provider may also do a rectal exam during your pelvic exam. Your provider may ask you to: 1. Bear down and push (like you are having a bowel movement) so he or she can see if your bladder or other part of your body protrudes into the vagina. 2. Contract the muscles of your pelvis to check the strength of your pelvic muscles.  3. Do several types of urine, nerve and muscle tests of the pelvis and around the bladder to see what type of treatment is best for you.   Symptoms Symptoms of pelvic support problems depend on the organ involved, but may include:  urine leakage  stain or fecal loss after a bowel movement trouble having bowel movements  ache in the lower abdomen, groin, or lower back  bladder infection  a feeling of heaviness, pulling, or fullness in the pelvis, or a feeling that something is falling out of the vagina  an organ protruding from your vaginal opening  feeling the  need to support the organs or perineal area to empty bladder or bowels painful sexual intercourse.  Many women feel pelvic pressure or trouble holding their urine immediately after childbirth. For some, these symptoms go away permanently, in others they return as they get older.  Treatment Options A prolapsed organ cannot repair itself. Contact your health care provider as soon as you notice symptoms of a problem. Treatment depends on what the specific problem is and how far  advanced it is.  The symptoms caused by some pelvic support problems may simply be treated with changes in diet, medicine to soften the stool, weight loss, or avoiding strenuous activities. You may also do pelvic floor exercises to help strengthen your pelvic muscles.  Some cases of prolapse may require a special support device made from plastic or rubber called a pessary that fits into the vagina to support the uterus, vagina, or bladder. A pessary can also help women who leak urine when coughing, straining, or exercising. In mild cases, a tampon or vaginal diaphragm may be used instead of a pessary.  Talk to your doctor or health care provider about these options. In serious cases, surgery may be needed to put the organs back into their proper place. The uterus may be removed because of the pressure it puts on the bladder.  Your doctor will know what surgery will be best for you. How can I prevent pelvic support problems?  You can help prevent pelvic support problems by:  maintaining a healthy lifestyle  continuing to do pelvic floor exercises after you deliver a baby  maintaining a healthy weight  avoiding a lot of heavy lifting and lifting with your legs (not from your waist)  treating constipation and avoid getting     Prolapse support hose  PhoneCaptions.uy  https://www.mypelvicbra.shop/  https://www.pelvicexercises.com.au/[email or web address]/uploads/2015/09/Prolapse-Exercises-eBook-V1.pdf  https://thepogp.co.uk/_userfiles/pages/files/POGP-Prolapse_2.pdf  Cindy Roman, PT [facility] [address] [place], [place] [address] W: [phone] [email or web address]

## 2023-06-11 NOTE — Therapy (Signed)
OUTPATIENT PHYSICAL THERAPY FEMALE PELVIC TREATMENT  Patient Name: Cindy Roman MRN: [id number] DOB:1956-03-25, 67 y.o., female Today's Date: 06/11/2023  END OF SESSION:  PT End of Session - 06/11/23 1407     Visit Number 3    Date for PT Re-Evaluation 07/18/23    Authorization Type Medicare    Authorization - Visit Number 3    Authorization - Number of Visits 10    PT Start Time 1400    PT Stop Time 1445    PT Time Calculation (min) 45 min    Activity Tolerance Patient tolerated treatment well    Behavior During Therapy [facility] for tasks assessed/performed             Past Medical History:  Diagnosis Date   Breast cancer (HCC) 2021   COVID 12/05/2020   Past Surgical History:  Procedure Laterality Date   ANKLE SURGERY     BREAST BIOPSY Left 2003   BREAST LUMPECTOMY Right 12/22/2020   DCIS   BREAST LUMPECTOMY WITH RADIOACTIVE SEED LOCALIZATION Right 12/22/2020   Procedure: RIGHT BREAST LUMPECTOMY WITH RADIOACTIVE SEED LOCALIZATION;  Surgeon: Emelia Loron, MD;  Location: [facility];  Service: General;  Laterality: Right;   CHOLECYSTECTOMY     WRIST SURGERY     Patient Active Problem List   Diagnosis Date Noted   Genital herpes simplex 06/08/2021   Ductal carcinoma in situ (DCIS) of right breast 10/12/2020   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   Left knee pain 04/24/2018   Toe ulcer, left, limited to breakdown of skin (HCC) 04/24/2018   Unilateral primary osteoarthritis, right hip 05/20/2017   Unilateral primary osteoarthritis, right knee 05/20/2017    PCP: Merri Brunette, MD  REFERRING PROVIDER: Selmer Dominion, NP   REFERRING DIAG: N81.4 (ICD-10-CM) - Uterine prolapse  THERAPY DIAG:  Muscle weakness (generalized)  Rationale for Evaluation and Treatment: Rehabilitation  ONSET DATE: 2022  SUBJECTIVE:                                                                                                                                                                                            SUBJECTIVE STATEMENT: I have stopped using the vaginal cream. I feel like I have a yeast infection.   PAIN:  Are you having pain? No  PRECAUTIONS: Other: breast cancer  WEIGHT BEARING RESTRICTIONS: No  FALLS:  Has patient fallen in last 6 months? No  LIVING ENVIRONMENT: Lives with: lives with their spouse   OCCUPATION: real estate, walks on treadmill at gym, lift weights, yoga  PLOF: Independent  PATIENT GOALS: reduce the prolapse and strengthen the pelvic  floor  PERTINENT HISTORY:  Ductal carcinoma in situ (DCIS) of right breast: Is in a study in which she did not have radiation, only had surgery and Tomoxifen ; cholecystectomy    BOWEL MOVEMENT: strain every once in awhile.   URINATION: Pain with urination: No Fully empty bladder: Yes:   Stream: Strong Urgency: No Frequency: Day time voids 3-4.  Nocturia: 1 times per night to void.  Leakage:  none   INTERCOURSE: Pain with intercourse:  no pain , aware of the prolapse with intercourse Ability to have vaginal penetration:  Yes:   Climax: yes Marinoff Scale: 0/3  PREGNANCY: Vaginal deliveries 2 Tearing Yes: some stitches C-section deliveries 1 Currently pregnant No  PROLAPSE: Stage I/IV Anterior, II/IV Uterine, and I/V posterior prolapse.    OBJECTIVE:   DIAGNOSTIC FINDINGS:  Pelvic floor strength II/V, puborectalis  Pelvic floor musculature: Right levator minimally tender, Right obturator mildly tender, Left levator non-tender, Left obturator non-tender PVR of 3 ml was obtained by bladder scan.      COGNITION: Overall cognitive status: Within functional limits for tasks assessed     SENSATION: Light touch: Appears intact Proprioception: Appears intact   POSTURE: No Significant postural limitations  PELVIC ALIGNMENT:  LUMBARAROM/PROM: Full lumbar ROM   LOWER EXTREMITY ROM:  Passive ROM Right eval Left eval  Hip external  rotation 50 30   (Blank rows = not tested)  LOWER EXTREMITY MMT:  MMT Right eval Left eval  Hip extension 4/5 4/5   PALPATION:   General  restrictions of the c-section scar, decreased contraction of the lower abdominals                External Perineal Exam intact                             Internal Pelvic Floor tightness along the sides of the introitus and levator ani; tightness on the right side of the bladder, good mobility of the cervix  Patient confirms identification and approves PT to assess internal pelvic floor and treatment Yes  PELVIC MMT:   MMT eval 05/16/23  Vaginal 2/5 initially then went to 3/5 with verbal cues and manual work to the sides of the introitus 3/5 with circular hug and lift  (Blank rows = not tested)        TONE: average  PROLAPSE: Anterior and posterior wall weakness,   TODAY'S TREATMENT:    06/11/23  Therapeutic activities: Functional strengthening activities: Educated patient on posture and which ones place more strain on the pelvic floor Educated her on keeping the distance between the rib cage and pubic bone to not put pressure on the pelvic organs Educated patient on squatting at hips and not waist, breathing out on hardest part of lifting Educated patient different ways to lay on her back to reset the prolapse and reduce the pressure on the pelvic organs Educated patient on why her Estrogen cream will help the health of her tissue vaginally and how it can help with the feelings of UTI and help with the muscle strength    05/16/23 Manual: Internal pelvic floor techniques: No emotional/communication barriers or cognitive limitation. Patient is motivated to learn. Patient understands and agrees with treatment goals and plan. PT explains patient will be examined in standing, sitting, and lying down to see how their muscles and joints work. When they are ready, they will be asked to remove their underwear so PT can examine their  perineum. The  patient is also given the option of providing their own chaperone as one is not provided in our facility. The patient also has the right and is explained the right to defer or refuse any part of the evaluation or treatment including the internal exam. With the patient's consent, PT will use one gloved finger to gently assess the muscles of the pelvic floor, seeing how well it contracts and relaxes and if there is muscle symmetry. After, the patient will get dressed and PT and patient will discuss exam findings and plan of care. PT and patient discuss plan of care, schedule, attendance policy and HEP activities.  Going through the vaginal canal working on the introitus, levator ani, perineal body, release along the left side of the uterus and bladder Urogenital diaphragm release externally going through the different fascial levels Neuromuscular re-education: Pelvic floor contraction training: Therapist index finger in the vaginal canal working on contraction with lift and giving tapping cues to muscles Supine with legs elevated contraction of pelvic floor 10 x hold 5 sec Supine legs elevated with ball squeeze and pelvic floor contraction 10 x Educated patient on different pelvic biofeedback units for home use to work on pelvic floor contraction Exercises: Stretches/mobility: Strengthening: Therapeutic activities: Functional strengthening activities:                                                                                                                            DATE: 04/25/23  EVAL See below   PATIENT EDUCATION:  05/16/23 Education details:  [email or web address], gave patient information on prolapse management, what prolapses are and information on pessaries, educate patient on how to place the cream in the vaginal canal and rub around the introitus.  Person educated: Patient Education method: Explanation, Demonstration, Tactile cues, Verbal cues, and Handouts Education comprehension: verbalized  understanding, returned demonstration, verbal cues required, tactile cues required, and needs further education  HOME EXERCISE PROGRAM: 05/16/23 Access Code: [email or web address] URL: [email or web address] Date: 05/16/2023 Prepared by: Eulis Legacy  Exercises - Pelvic Floor Muscle Contraction and Pelvic Tilt to Bridge With Hips Elevated (for Pelvic Organ Prolapse)  - 1 x daily - 7 x weekly - 1 sets - 10 reps - 5  sec hold - Pelvic Floor Muscle Contraction and Adductor Squeeze With Hips Elevated (for Pelvic Organ Prolapse)  - 1 x daily - 7 x weekly - 3 sets - 10 reps  ASSESSMENT:  CLINICAL IMPRESSION: Patient is a 67 y.o. female who was seen today for physical therapy  treatment for uterine prolapse. Pelvic floor strength increased to 3/5 with good circular hug and lift of the therapist finger.   She had some fascial tightness along the left side of the bladder and uterus. Patient did have some bleeding vaginal after the manual work due to the thinness of her tissue. Patient would benefit from skilled therapy to improve pelvic floor contraction and understand ways to manage prolapse.   OBJECTIVE IMPAIRMENTS: decreased coordination, decreased strength,  increased fascial restrictions, and increased muscle spasms.   ACTIVITY LIMITATIONS: lifting  PARTICIPATION LIMITATIONS: shopping and community activity  PERSONAL FACTORS: 1-2 comorbidities: Ductal carcinoma in situ (DCIS) of right breast: Is in a study in which she did not have radiation, only had surgery and Tomoxifen ; cholecystectomy  are also affecting patient's functional outcome.   REHAB POTENTIAL: Excellent  CLINICAL DECISION MAKING: Stable/uncomplicated  EVALUATION COMPLEXITY: Low   GOALS: Goals reviewed with patient? Yes  SHORT TERM GOALS: Target date: 05/21/23  Patient understands ways to manage prolapse.  Baseline: Goal status: Met 06/11/23   LONG TERM GOALS: Target date: 07/18/23  Patient independent with advanced  HEP for core and pelvic floor strength.  Baseline:  Goal status: INITIAL  2.  Patient understands correct ways to toilet to reduce straining and pressure on the pelvic floor.  Baseline:  Goal status: INITIAL  3.  Pelvic floor strength is 3/5 holding for 10 seconds so she is able to support her prolapse better.  Baseline:  Goal status: Met 06/11/23  4.  Patient understands how to exercise at the gym correctly to reduce strain on prolapse.  Baseline:  Goal status: INITIAL  5.  Patient is able to lift 10# with correct technique and keep distance between the rib cage and pubic bone.  Baseline:  Goal status: INITIAL   PLAN:  PT FREQUENCY: 1x/week  PT DURATION: 12 weeks  PLANNED INTERVENTIONS: Therapeutic exercises, Therapeutic activity, Neuromuscular re-education, Patient/Family education, Electrical stimulation, scar mobilization, Biofeedback, and Manual therapy  PLAN FOR NEXT SESSION:  start prolapse exercise, work on hip extension, lifting 10 #   Eulis Widjaja, PT 06/11/23 2:08 PM

## 2023-06-20 ENCOUNTER — Encounter: Payer: Medicare Other | Admitting: Physical Therapy

## 2023-06-24 ENCOUNTER — Ambulatory Visit: Payer: No Typology Code available for payment source | Admitting: Obstetrics and Gynecology

## 2023-07-22 ENCOUNTER — Other Ambulatory Visit (HOSPITAL_BASED_OUTPATIENT_CLINIC_OR_DEPARTMENT_OTHER): Payer: Self-pay

## 2023-07-22 MED ORDER — PAXLOVID (150/100) 10 X 150 MG & 10 X 100MG PO TBPK
ORAL_TABLET | ORAL | 0 refills | Status: DC
  Filled 2023-07-22: qty 20, 5d supply, fill #0

## 2023-09-23 ENCOUNTER — Other Ambulatory Visit: Payer: Self-pay | Admitting: Hematology and Oncology

## 2023-09-23 DIAGNOSIS — Z853 Personal history of malignant neoplasm of breast: Secondary | ICD-10-CM

## 2023-10-10 NOTE — Progress Notes (Signed)
[facility] - Clinic Note  10/11/2023     CHIEF COMPLAINT Patient presents for Retina Follow Up   HISTORY OF PRESENT ILLNESS: Cindy Roman is a 67 y.o. female who presents to the clinic today for:   HPI     Retina Follow Up   Patient presents with  Dry AMD.  In both eyes.  This started 6 months ago.  I, the attending physician,  performed the HPI with the patient and updated documentation appropriately.        Comments   Patient here for 6 months retina follow up for non exu ARMD OU. Patient states vision doing ok. No eye pain. Put on thyroid medicine 1 months ago. Not using drops.       Last edited by Rennis Chris, MD on 10/13/2023 11:27 PM.     Pt states vision is the same, she sees a floater every once in awhile   Referring physician: Merri Brunette, MD [phone] [address] [place],  [place] [address]  HISTORICAL INFORMATION:   Selected notes from the MEDICAL RECORD NUMBER Referred by Dr. Elmer Picker for maculopathy OS LEE:  Ocular Hx- PMH-    CURRENT MEDICATIONS: No current outpatient medications on file. (Ophthalmic Drugs)   No current facility-administered medications for this visit. (Ophthalmic Drugs)   Current Outpatient Medications (Other)  Medication Sig   estradiol (ESTRACE) 0.1 MG/GM vaginal cream Place 0.5 g vaginally 2 (two) times a week. Place 0.5g nightly for two weeks then twice a week after   levothyroxine (SYNTHROID) 50 MCG tablet Take 50 mcg by mouth daily before breakfast.   Misc Natural Products (DAILY HERBS IMMUNE DEFENSE PO) Take by mouth.   Multiple Vitamins-Minerals (PRESERVISION AREDS PO) Take by mouth.   Nutritional Supplements (JUICE PLUS FIBRE PO) Take by mouth.   tamoxifen (NOLVADEX) 20 MG tablet Take 1 tablet (20 mg total) by mouth daily.   nirmatrelvir & ritonavir (PAXLOVID, 150/100,) 10 x 150 MG & 10 x 100MG  TBPK Take as directed twice daily for 5 days (Patient not taking: Reported on 10/11/2023)   No  current facility-administered medications for this visit. (Other)   REVIEW OF SYSTEMS: ROS   Positive for: Eyes Last edited by Laddie Aquas, COA on 10/11/2023  9:17 AM.       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   BREAST LUMPECTOMY WITH RADIOACTIVE SEED LOCALIZATION Right 12/22/2020   Procedure: RIGHT BREAST LUMPECTOMY WITH RADIOACTIVE SEED LOCALIZATION;  Surgeon: Emelia Loron, MD;  Location: [facility];  Service: General;  Laterality: Right;   CHOLECYSTECTOMY     WRIST SURGERY     FAMILY HISTORY Family History  Problem Relation Age of Onset   Diabetes Mother    Breast cancer Maternal Aunt    SOCIAL HISTORY Social History   Tobacco Use   Smoking status: Never   Smokeless tobacco: Never  Vaping Use   Vaping status: Never Used  Substance Use Topics   Alcohol use: Yes    Comment: "couple glasses of wine a day"   Drug use: No       OPHTHALMIC EXAM:  Base Eye Exam     Visual Acuity (Snellen - Linear)       Right Left   Dist Silvana 20/30 20/40   Dist ph Ferguson NI NI         Tonometry (Tonopen, 9:15 AM)       Right Left   Pressure 18 17         Pupils       Dark Light Shape React APD   Right 3 2 Round Brisk None   Left 3 2 Round Brisk None         Visual Fields (Counting fingers)       Left Right    Full Full         Extraocular Movement       Right Left    Full, Ortho Full, Ortho         Neuro/Psych     Oriented x3: Yes   Mood/Affect: Normal         Dilation     Both eyes: 1.0% Mydriacyl, 2.5% Phenylephrine @ 9:14 AM           Slit Lamp and Fundus Exam     Slit Lamp Exam       Right Left    Lids/Lashes Dermatochalasis - upper lid Dermatochalasis - upper lid   Conjunctiva/Sclera White and quiet White and quiet   Cornea trace PEE 1-2+PEE, tear film debris   Anterior Chamber deep and clear deep and clear   Iris Round and dilated Round and dilated   Lens 2+ Nuclear sclerosis, 2-3+ Cortical cataract 2+ Nuclear sclerosis, 2-3+ Cortical cataract   Anterior Vitreous Vitreous syneresis Vitreous syneresis         Fundus Exam       Right Left   Disc Pink and Sharp, temporal PPA/PPP Pink and Sharp, mild PPP   C/D Ratio 0.3 0.3   Macula Flat, Good foveal reflex, RPE mottling, rare drusen; No heme or edema Flat, Good foveal reflex, RPE mottling and clumping, drusen, No heme or edema   Vessels attenuated, mild tortuosity attenuated, mild tortuosity   Periphery Attached, peripheral cystoid degeneration Attached, mild reticular degeneration           IMAGING AND PROCEDURES  Imaging and Procedures for 10/11/2023  OCT, Retina - OU - Both Eyes       Right Eye Quality was good. Central Foveal Thickness: 276. Progression has been stable. Findings include normal foveal contour, no IRF, no SRF, retinal drusen (Partial PVD, focal peripapillary PED / [facility] caught on widefield -- no fluid, stable).   Left Eye Quality was good. Central Foveal Thickness: 267. Progression has been stable. Findings include normal foveal contour, no IRF, no SRF, retinal drusen (Partial PVD, focal peripapillary PED / [facility] caught on widefield -- no fluid, stable, foveal notch).   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 CME - cystoid macular edema. PED - pigment epithelial  detachment. IRF - intraretinal fluid. SRF - subretinal fluid. EZ - ellipsoid zone. ERM - epiretinal membrane. ORA - outer retinal atrophy. ORT - outer retinal tubulation. SRHM - subretinal hyper-reflective material. IRHM - intraretinal hyper-reflective material  ASSESSMENT/PLAN:    ICD-10-CM   1. Intermediate stage nonexudative age-related macular degeneration of left eye  H35.3122 OCT, Retina - OU - Both Eyes    2. 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 Combined forms of age-related cataract of both eyes  H25.813        1,2. Age related macular degeneration, non-exudative, both eyes (OS > OD) - pt reports history of being diagnosed with macular degeneration in 3rd grade - BCVA OD 20/30, OS 20/40 -- stable OU - exam shows significant drusen and RPE atrophy in posterior pole OS, while OD has minimal drusen - OCT shows OD: Partial PVD, focal peripapillary PED / [facility] caught on widefield -- no fluid, stable; OS: Partial PVD, focal peripapillary PED / [facility] caught on widefield -- no fluid, stable, +foveal notch - FA 2.6.24 shows staining OS, but no CNV; OD normal study  - Recommend amsler grid monitoring  - differential includes macular dystrophy OS - no retinal or ophthalmic interventions indicated or recommended at this time  - f/u 9 months, sooner prn - DFE, OCT  3. Mixed Cataract OU - The symptoms of cataract, surgical options, and treatments and risks were discussed with patient. - discussed diagnosis and progression - under the expert care of [facility]  Ophthalmic Meds Ordered this visit:  No orders of the defined types were placed in this encounter.    Return in about 9 months (around 07/10/2024) for f/u non-exu ARMD OU, DFE, OCT.  There are no Patient Instructions on file for this visit.   This document serves as a record of services personally performed by Karie Chimera, MD, PhD. It was created on  their behalf by Berlin Hun COT, an ophthalmic technician. The creation of this record is the provider's dictation and/or activities during the visit.    Electronically signed by: Berlin Hun COT 11.07.24 11:45 PM  This document serves as a record of services personally performed by Karie Chimera, MD, PhD. It was created on their behalf by Glee Arvin. Manson Passey, OA an ophthalmic technician. The creation of this record is the provider's dictation and/or activities during the visit.    Electronically signed by: Glee Arvin. Manson Passey, OA 10/13/23 11:45 PM  Karie Chimera, M.D., Ph.D. Diseases & Surgery of the Retina and Vitreous [facility]  I have reviewed the above documentation for accuracy and completeness, and I agree with the above. Karie Chimera, M.D., Ph.D. 10/13/23 11:46 PM   Abbreviations: M myopia (nearsighted); A astigmatism; H hyperopia (farsighted); P presbyopia; Mrx spectacle prescription;  CTL contact lenses; OD right eye; OS left eye; OU both eyes  XT exotropia; ET esotropia; PEK punctate epithelial keratitis; PEE punctate epithelial erosions; DES dry eye syndrome; MGD meibomian gland dysfunction; ATs artificial tears; PFAT's preservative free artificial tears; NSC nuclear sclerotic cataract; PSC posterior subcapsular cataract; ERM epi-retinal membrane; PVD posterior vitreous detachment; RD retinal detachment; DM diabetes mellitus; DR diabetic retinopathy; NPDR non-proliferative diabetic retinopathy; PDR proliferative diabetic retinopathy; CSME clinically significant macular edema; DME diabetic macular edema; dbh dot blot hemorrhages; CWS cotton wool spot; POAG primary open angle glaucoma; C/D cup-to-disc ratio; HVF humphrey visual field; GVF goldmann visual field; OCT optical coherence tomography; IOP intraocular pressure; BRVO Branch retinal vein occlusion; CRVO central retinal vein occlusion; CRAO central retinal artery occlusion; BRAO branch retinal  artery occlusion; RT retinal tear; SB scleral buckle; PPV pars plana vitrectomy; VH Vitreous  hemorrhage; PRP panretinal laser photocoagulation; IVK intravitreal kenalog; VMT vitreomacular traction; MH Macular hole;  NVD neovascularization of the disc; NVE neovascularization elsewhere; AREDS age related eye disease study; ARMD age related macular degeneration; POAG primary open angle glaucoma; EBMD epithelial/anterior basement membrane dystrophy; ACIOL anterior chamber intraocular lens; IOL intraocular lens; PCIOL posterior chamber intraocular lens; Phaco/IOL phacoemulsification with intraocular lens placement; PRK photorefractive keratectomy; LASIK laser assisted in situ keratomileusis; HTN hypertension; DM diabetes mellitus; COPD chronic obstructive pulmonary disease

## 2023-10-11 ENCOUNTER — Encounter (INDEPENDENT_AMBULATORY_CARE_PROVIDER_SITE_OTHER): Payer: Self-pay | Admitting: Ophthalmology

## 2023-10-11 ENCOUNTER — Ambulatory Visit (INDEPENDENT_AMBULATORY_CARE_PROVIDER_SITE_OTHER): Payer: Medicare Other | Admitting: Ophthalmology

## 2023-10-11 DIAGNOSIS — H353122 Nonexudative age-related macular degeneration, left eye, intermediate dry stage: Secondary | ICD-10-CM | POA: Diagnosis not present

## 2023-10-11 DIAGNOSIS — H25813 Combined forms of age-related cataract, bilateral: Secondary | ICD-10-CM

## 2023-10-11 DIAGNOSIS — H353111 Nonexudative age-related macular degeneration, right eye, early dry stage: Secondary | ICD-10-CM

## 2023-10-13 ENCOUNTER — Encounter (INDEPENDENT_AMBULATORY_CARE_PROVIDER_SITE_OTHER): Payer: Self-pay | Admitting: Ophthalmology

## 2023-10-20 IMAGING — MG DIGITAL DIAGNOSTIC BILAT W/ TOMO W/ CAD
6 of 9 series · 6 of 25 positions shown · non-contrast
Comparison: Previous exam(s).

CLINICAL DATA: Right breast lumpectomy for DCIS and atypical
lobular hyperplasia in December 2020.

EXAM:
DIGITAL DIAGNOSTIC BILATERAL MAMMOGRAM WITH TOMOSYNTHESIS AND CAD
TECHNIQUE: Bilateral digital diagnostic mammography and breast tomosynthesis
was performed. The images were evaluated with computer-aided
detection.

[R MLO]
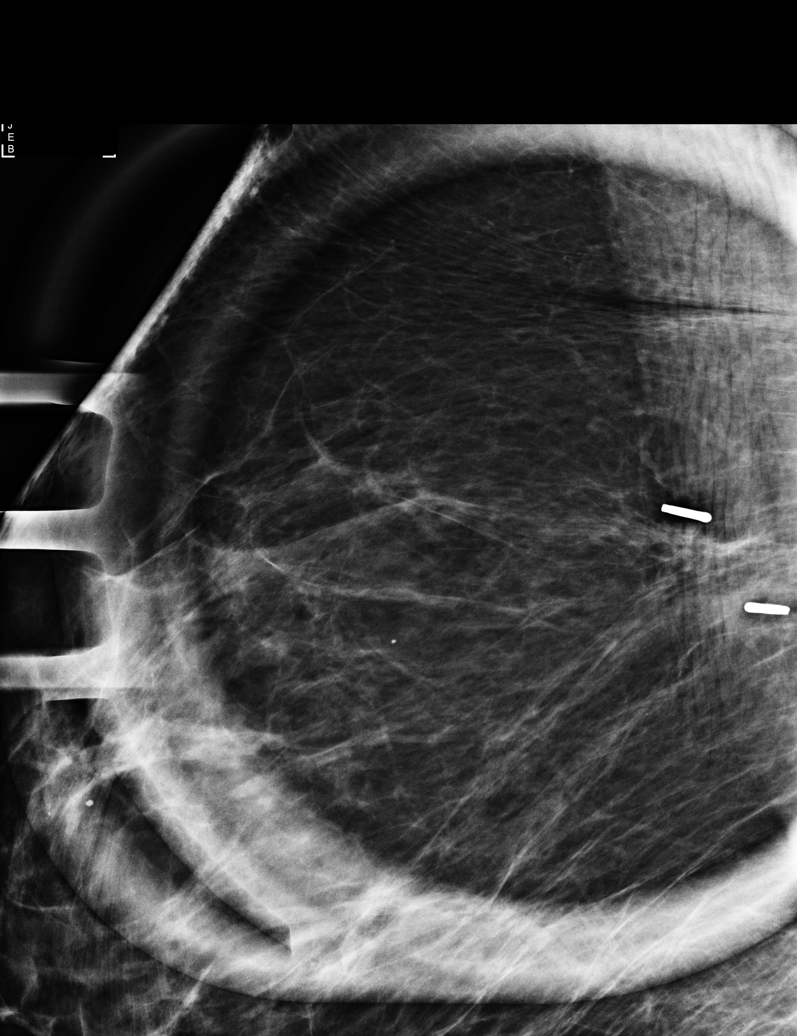

[R MLO synth-2D]
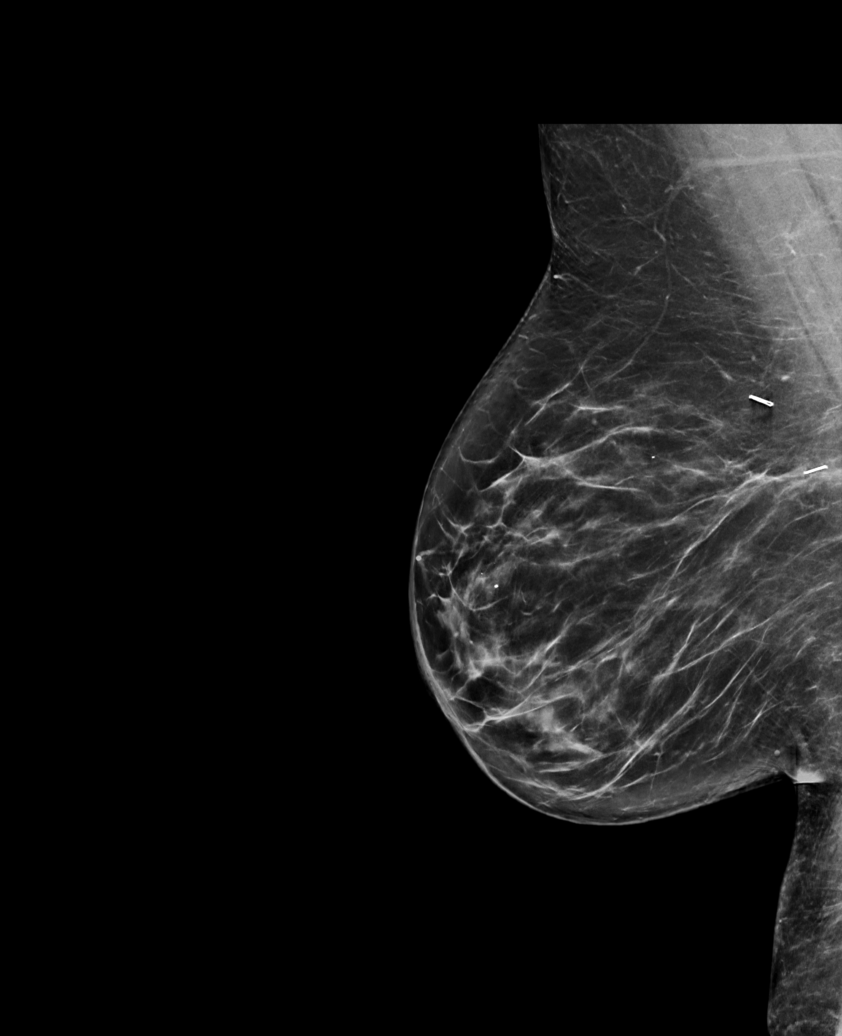

[R CC synth-2D]
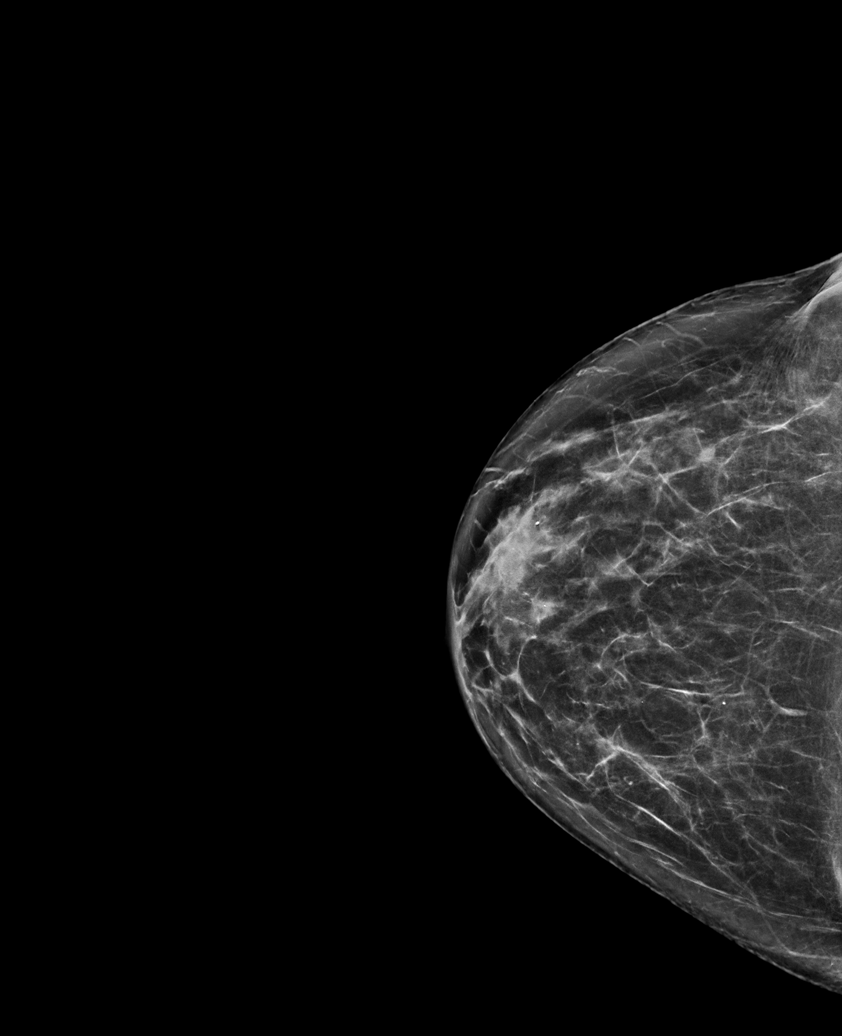

[L MLO synth-2D]
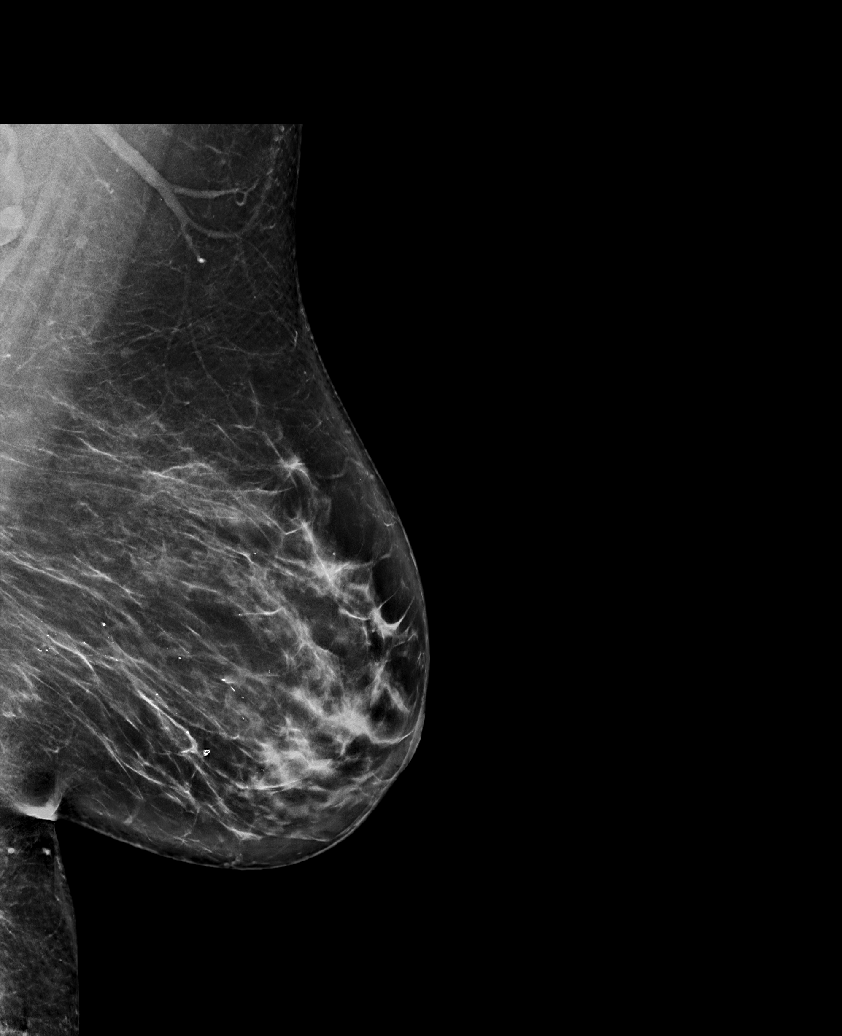

[L CC synth-2D]
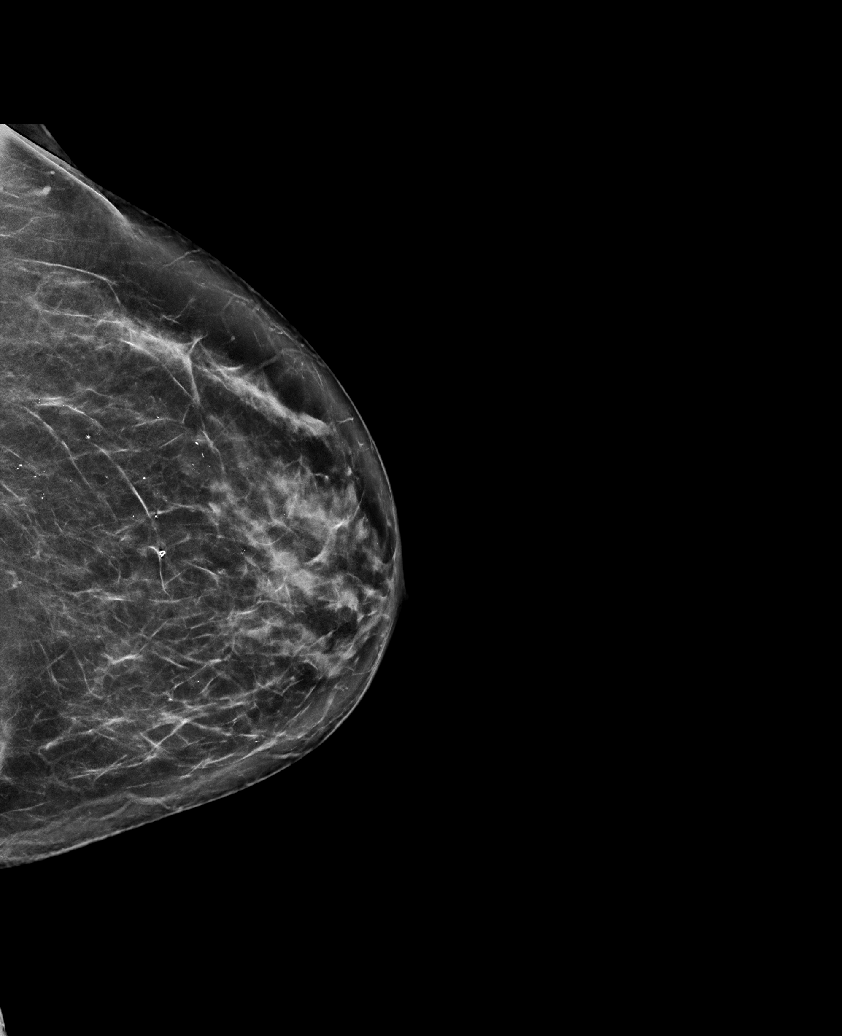

[R CC tomo · tomo slice 37/72.0]
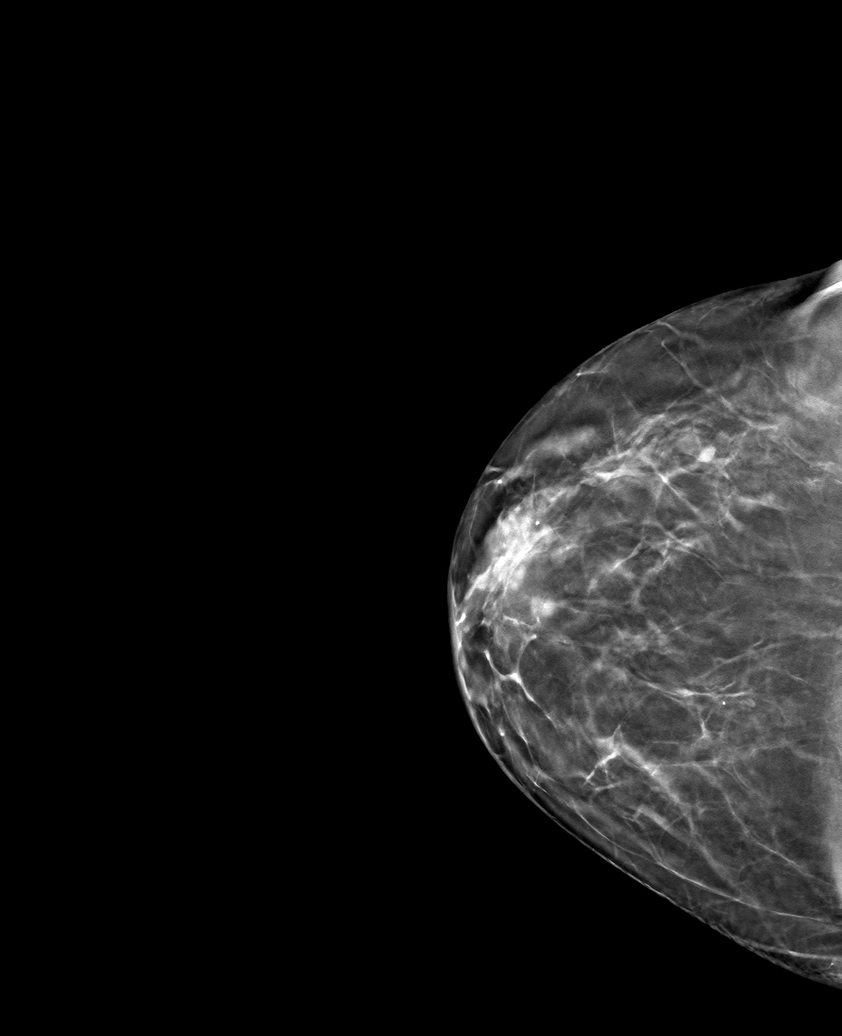

[6 of 25 positions shown; findings below may reference images not displayed]

ACR Breast Density Category b: There are scattered areas of
fibroglandular density.
FINDINGS: Mammographically, there are no suspicious masses, areas of
nonsurgical architectural distortion or new clustered
calcifications. Expected post lumpectomy changes in the right breast
upper outer quadrant, posterior depth.
IMPRESSION: No mammographic evidence of malignancy, status post right
lumpectomy.

RECOMMENDATION:
Diagnostic mammogram is suggested in 1 year. (Code:[id number]-Q-YY6)

I have discussed the findings and recommendations with the patient.
If applicable, a reminder letter will be sent to the patient
regarding the next appointment.

BI-RADS CATEGORY  2: Benign.

## 2023-11-07 ENCOUNTER — Ambulatory Visit
Admission: RE | Admit: 2023-11-07 | Discharge: 2023-11-07 | Disposition: A | Discharge status: Home / Self Care | Payer: Medicare Other | Source: Ambulatory Visit | Attending: Hematology and Oncology | Admitting: Hematology and Oncology

## 2023-11-07 DIAGNOSIS — Z853 Personal history of malignant neoplasm of breast: Secondary | ICD-10-CM

## 2023-11-11 ENCOUNTER — Inpatient Hospital Stay: Payer: Medicare Other

## 2023-11-11 ENCOUNTER — Ambulatory Visit
Payer: Medicare Other | Attending: Hematology and Oncology | Admitting: Rehabilitative and Restorative Service Providers"

## 2023-11-11 ENCOUNTER — Encounter: Payer: Self-pay | Admitting: Rehabilitative and Restorative Service Providers"

## 2023-11-11 ENCOUNTER — Encounter: Payer: Self-pay | Admitting: *Deleted

## 2023-11-11 ENCOUNTER — Inpatient Hospital Stay: Payer: Medicare Other | Attending: Hematology and Oncology | Admitting: Hematology and Oncology

## 2023-11-11 ENCOUNTER — Other Ambulatory Visit: Payer: Self-pay

## 2023-11-11 VITALS — BP 135/67 | HR 73 | Temp 97.7°F | Resp 18 | Ht 66.4 in | Wt 187.5 lb

## 2023-11-11 DIAGNOSIS — R42 Dizziness and giddiness: Secondary | ICD-10-CM | POA: Insufficient documentation

## 2023-11-11 DIAGNOSIS — E063 Autoimmune thyroiditis: Secondary | ICD-10-CM | POA: Diagnosis not present

## 2023-11-11 DIAGNOSIS — Z79890 Hormone replacement therapy (postmenopausal): Secondary | ICD-10-CM | POA: Diagnosis not present

## 2023-11-11 DIAGNOSIS — Z79810 Long term (current) use of selective estrogen receptor modulators (SERMs): Secondary | ICD-10-CM | POA: Insufficient documentation

## 2023-11-11 DIAGNOSIS — M6281 Muscle weakness (generalized): Secondary | ICD-10-CM | POA: Diagnosis present

## 2023-11-11 DIAGNOSIS — D0511 Intraductal carcinoma in situ of right breast: Secondary | ICD-10-CM

## 2023-11-11 DIAGNOSIS — R5383 Other fatigue: Secondary | ICD-10-CM | POA: Diagnosis not present

## 2023-11-11 DIAGNOSIS — Z006 Encounter for examination for normal comparison and control in clinical research program: Secondary | ICD-10-CM | POA: Diagnosis not present

## 2023-11-11 DIAGNOSIS — R2689 Other abnormalities of gait and mobility: Secondary | ICD-10-CM | POA: Diagnosis present

## 2023-11-11 DIAGNOSIS — H8110 Benign paroxysmal vertigo, unspecified ear: Secondary | ICD-10-CM | POA: Diagnosis not present

## 2023-11-11 LAB — RESEARCH LABS

## 2023-11-11 NOTE — Therapy (Signed)
OUTPATIENT PHYSICAL THERAPY VESTIBULAR EVALUATION     Patient Name: Cindy Roman MRN: [id number] DOB:1956-11-14, 67 y.o., female Today's Date: 11/11/2023  END OF SESSION:  PT End of Session - 11/11/23 1025     Visit Number 1    Date for PT Re-Evaluation 12/20/23    Authorization Type Medicare    Progress Note Due on Visit 10    PT Start Time 1015    PT Stop Time 1055    PT Time Calculation (min) 40 min    Activity Tolerance Patient tolerated treatment well    Behavior During Therapy [facility] for tasks assessed/performed             Past Medical History:  Diagnosis Date   Breast cancer (HCC) 2021   COVID 12/05/2020   Past Surgical History:  Procedure Laterality Date   ANKLE SURGERY     BREAST BIOPSY Left 2003   BREAST LUMPECTOMY Right 12/22/2020   DCIS   BREAST LUMPECTOMY WITH RADIOACTIVE SEED LOCALIZATION Right 12/22/2020   Procedure: RIGHT BREAST LUMPECTOMY WITH RADIOACTIVE SEED LOCALIZATION;  Surgeon: Emelia Loron, MD;  Location: [facility];  Service: General;  Laterality: Right;   CHOLECYSTECTOMY     WRIST SURGERY     Patient Active Problem List   Diagnosis Date Noted   Genital herpes simplex 06/08/2021   Ductal carcinoma in situ (DCIS) of right breast 10/12/2020   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   Left knee pain 04/24/2018   Toe ulcer, left, limited to breakdown of skin (HCC) 04/24/2018   Unilateral primary osteoarthritis, right hip 05/20/2017   Unilateral primary osteoarthritis, right knee 05/20/2017    PCP: Merri Brunette, MD REFERRING PROVIDER: Serena Croissant, MD  REFERRING DIAG: H81.10 (ICD-10-CM) - Benign paroxysmal positional vertigo, unspecified laterality   THERAPY DIAG:  Dizziness and giddiness - Plan: PT plan of care cert/re-cert  Balance problem - Plan: PT plan of care cert/re-cert  ONSET DATE: 11/09/2023  Rationale for Evaluation and Treatment: Rehabilitation  SUBJECTIVE:   SUBJECTIVE  STATEMENT: Patient reports that she recently returned from a trip to [place] and [place] and was in her usual health.  States that Saturday night, she woke up with severe dizziness.  Patient went to see Dr Pamelia Hoit for her 6 month follow up and he referred her to PT for vestibular rehab. Pt accompanied by: self  PERTINENT HISTORY:  Hysterectomy planned for 11/14/2023 Ductal carcinoma in situ (DCIS) of right breast: Is in a study in which she did not have radiation, only had surgery and Tomoxifen ; cholecystectomy   PAIN:  Are you having pain? Yes: NPRS scale: 2/10 Pain location: headache Pain description: foggy and aching Aggravating factors: unknown Relieving factors: unknown  PRECAUTIONS: None  RED FLAGS: None   WEIGHT BEARING RESTRICTIONS: No  FALLS: Has patient fallen in last 6 months? No  LIVING ENVIRONMENT: Lives with: lives with their spouse Lives in: House/apartment Stairs:  one level home Has following equipment at home: None  PLOF: Independent and Leisure: travel, go for walks  PATIENT GOALS: To no longer be dizzy  OBJECTIVE:  Note: Objective measures were completed at Evaluation unless otherwise noted.  DIAGNOSTIC FINDINGS: n/a  COGNITION: Overall cognitive status: Within functional limits for tasks assessed   SENSATION: WFL  POSTURE:  No Significant postural limitations  Cervical ROM:    WFL, but some stiffness reported  STRENGTH: WFL   FUNCTIONAL TESTS:  MCTSIB: Condition 1: Avg of 3 trials: 30 sec, Condition 2: Avg of 3  trials: 30 sec, Condition 3: Avg of 3 trials: 30 sec, Condition 4: Avg of 3 trials: 20 sec, and Total Score: 110/120 Single leg Stance:  right- 26.08 sec, left- 24.33 sec  PATIENT SURVEYS:  Eval:  ABC scale 1360 / 1600 = 85.0 %  VESTIBULAR ASSESSMENT:   SYMPTOM BEHAVIOR:  Subjective history: Pt reports symptoms started Saturday night when lying down.  Symptoms come when lying down or bending forward.  Non-Vestibular  symptoms: headaches  Type of dizziness: Spinning/Vertigo  Frequency: daily  Duration: until she sits up  Aggravating factors: Induced by position change: lying supine  Relieving factors:  sitting up  Progression of symptoms: unchanged  OCULOMOTOR EXAM:  Ocular Alignment: normal  Ocular ROM: No Limitations  Spontaneous Nystagmus: absent  Gaze-Induced Nystagmus: absent  Smooth Pursuits: intact  Saccades: intact  Convergence/Divergence: WFL  VESTIBULAR - OCULAR REFLEX:   Head thrust for 1-2 beats of nystagmus to bilat sides   POSITIONAL TESTING: Right Dix-Hallpike: upbeating, left nystagmus Left Dix-Hallpike: nystagmus initially when going into supine position, but stopped quickly with having head towards left side (in position of test)     OTHOSTATICS: not done   VESTIBULAR TREATMENT:                                                                                                   DATE: 11/11/2023  Canalith Repositioning:  Epley Right: Number of Reps: 2 and Response to Treatment: symptoms improved Habituation:  Brandt-Daroff: comment: provided for HEP and demonstrated to patient   PATIENT EDUCATION: Education details: Issued HEP Person educated: Patient Education method: Explanation, Demonstration, and Handouts Education comprehension: verbalized understanding  HOME EXERCISE PROGRAM: Access Code: [email or web address] URL: [email or web address] Date: 11/11/2023 Prepared by: Reather Laurence  Exercises - Brandt-Daroff Vestibular Exercise  - 1 x daily - 7 x weekly - 5 reps - Self-Epley Maneuver Right Ear  - 1 x daily - 7 x weekly - 1-2 reps  Patient Education - What Is BPPV?  GOALS: Goals reviewed with patient? Yes  SHORT TERM GOALS: Target date: 11/22/2023  Pt will be independent with initial HEP. Baseline: Goal status: INITIAL   LONG TERM GOALS: Target date: 12/20/2023  Pt will be independent with advanced HEP. Baseline:  Goal status: INITIAL  2.   Patient will have negative Gilberto Better during last PT session. Baseline:  Goal status: INITIAL  3.  Patient will report at least an 80% improvement with dizziness and balance with all activities, including lying flat. Baseline:  Goal status: INITIAL  4.  Patient will have single leg stance of greater than 30 seconds bilat and modified CTSIB of 120/120 to decrease risk of falling. Baseline:  Goal status: INITIAL   ASSESSMENT:  CLINICAL IMPRESSION: Patient is a 67 y.o. female who was seen today for physical therapy evaluation and treatment for BPPV. Patient reports that she was in her usual health until 2 nights ago, when she woke up due to increased dizziness.  Patient presents with positive Gilberto Better on right side and proceeded with canalith repositioning with Epley Maneuver during session.  Patient educated on BPPV  and provided with an informational handout.  Patient presents with slight decrease in balance.  Patient would benefit from skilled PT to address her functional impairments to allow her to return to her prior level of independence and being able to sleep in her bed again without dizziness.  OBJECTIVE IMPAIRMENTS: decreased balance and dizziness.   ACTIVITY LIMITATIONS: sleeping  PARTICIPATION LIMITATIONS:  lying supine and procedures that require lying supine  PERSONAL FACTORS: 1 comorbidity: Hx of Breast Cancer, schedule hysterectomy on 11/14/23  are also affecting patient's functional outcome.   REHAB POTENTIAL: Good  CLINICAL DECISION MAKING: Stable/uncomplicated  EVALUATION COMPLEXITY: Low   PLAN:  PT FREQUENCY: 1-2x/week  PT DURATION: 6 weeks  PLANNED INTERVENTIONS: 97164- PT Re-evaluation, 97110-Therapeutic exercises, 97530- Therapeutic activity, 97112- Neuromuscular re-education, 97535- Self Care, 53664- Manual therapy, [phone]- Gait training, [phone]- Canalith repositioning, [id number]- Aquatic Therapy, 97014- Electrical stimulation (unattended), [id number]- Electrical  stimulation (manual), 97035- Ultrasound, Patient/Family education, Balance training, Stair training, Taping, Dry Needling, Vestibular training, Cryotherapy, and Moist heat  PLAN FOR NEXT SESSION: Gilberto Better and canalith repositioning as indicated, vestibular rehab, dry needling if indicated for headache    Reather Laurence, PT, DPT 11/11/23, 11:37 AM  [facility] [address], [address] [place], [place] [address] Phone # [phone] Fax [phone]

## 2023-11-11 NOTE — Assessment & Plan Note (Signed)
12/22/2020: Right lumpectomy Dwain Sarna): Intermediate grade DCIS, 1.2 cm, clear margins, ER 95%, PR 40% Tis NX stage 0   COMET clinical trial: Randomized to surgery   Treatment plan: 1. Patient refused adjuvant radiation 2. Adjuvant antiestrogen therapy with tamoxifen x5 years started February 2022   Tamoxifen toxicities:  Intermittent muscle cramps that wake her up at night: Mild   Slight fatigue: Possibly related to her thyroid function.  Being evaluated by her primary care physician. Abnormal Pap smear: Was prescribed estrogen cream.    Breast cancer surveillance: Mammogram 11/07/2023: Benign Density Cat B Breast exam 11/11/2023: Benign   Patient's daughter lives in [place]  Return to clinic in 6 months for follow-up

## 2023-11-11 NOTE — Research (Signed)
AFT - 25: COMPARING AN OPERATION TO MONITORING, WITH OR WITHOUT ENDOCRINE THERAPY (COMET) FOR LOW RISK DCIS: A PHASE III PROSPECTIVE RANDOMIZED TRIAL    36- month study visit phone call   The pt was into the cancer center this morning unaccompanied for her month 36 study visit.  The pt states she recently traveled to [place] and [place].  The pt is scheduled for a hysterectomy on 11/14/23.  She said that she has been followed by her gynecologist for awhile for some abnormal Pap findings (all benign findings- not malignant findings).  The pt said that this is an elective, planned surgery that will not require an overnight stay in the hospital.  She confirmed that she is still taking tamoxifen every day. Dr. Pamelia Hoit advised the pt to stop taking her tamoxifen today and to resume her tamoxifen 1 week after her surgery.  The pt verbalized understanding.  She said that she did have COVID-19 in July 2024, and she took Paxlovid with good relief of her symptoms.     Research labs - Month 36 research blood samples obtained at this visit. The samples will be shipped today.    Mammogram: Pt's bilateral mammogram was completed on 11/07/23.  Impression read "No mammographic evidence of malignancy involving either breast".   Questionnaires: Month 36 questionnaires completed by the patient on 11/04/23.  The pt was thanked for completing her questionnaires in a timely manner.     Solicited and Other/Adverse Events: Reviewed with patient and documented below. Patient with no hospital admissions and no SAE's since her last study visit in June 2024.  The pt said that she has begun medication for her thyroid problems.  She said that she recently developed vertigo when she is lying down.  Dr. Pamelia Hoit made a referral to a physical therapist for further evaluation of her vertigo symptoms.  Dr. Pamelia Hoit did not feel that the pt's thyroid problems (Hashimoto's thyroiditis) or her new onset of vertigo is related to her tamoxifen. The  pt confirmed that she has "moderate" arthralgia, mainly her right hip (grade 2).  She denies myalgia (leg cramps) since she began taking magnesium. She also reports mild hot flashes.  Research nurse obtained the following attributions from Dr. Pamelia Hoit after the pt's visit on 11/11/23.   Event Grade Onset Date Resolved Date Drug Name Attribution Treatment Comments  Arthralgia  Grade 2 11/12/22 Ongoing  Tamoxifen Unrelated   None reported  Mainly right hip pain.   Hot flashes Grade 1 11/11/23 Ongoing  Tamoxifen Possible  None reported  "Mild", per patient report  Vertigo Grade 2 11/11/23 Ongoing Tamoxifen Unrelated Physical Therapy Recent onset, mainly when lying down  Hypo-thyroid Grade 2 05/10/23 Ongoing  Tamoxifen Unrelated Takes Synthroid Pt reports Hashimoto's thyroiditis  Upper respiratory infection Covid-19 Grade 2 ~July 2024 Resolved  Tamoxifen Unrelated Took Paxlovid with good relief of symptoms   Not evaluated Solicited AE's include the following:  osteoporosis  Pt denied the following Solicited AE's:  allergic reaction, fever, myalgia, acute coronary syndrome, ischemia cerebrovascular, hypertension, nausea, fracture, and cholesterol   Plan: Mrs. Goyal will be scheduled to return in approximately 6 months for MD visit in June 2025.  I thanked patient for her continued participation and support of study.   Janan Ridge RN, BSN, CCRP Clinical Research Nurse Lead 11/11/2023 11:16 AM

## 2023-11-11 NOTE — Progress Notes (Signed)
Cindy Roman Care Team: Merri Brunette, MD as PCP - General (Family Medicine) Pershing Proud, RN as Oncology Nurse Navigator Donnelly Angelica, RN as Oncology Nurse Navigator  DIAGNOSIS:  Encounter Diagnoses  Name Primary?   Ductal carcinoma in situ (DCIS) of right breast Yes   Benign paroxysmal positional vertigo, unspecified laterality     SUMMARY OF ONCOLOGIC HISTORY: Oncology History  Ductal carcinoma in situ (DCIS) of right breast  10/12/2020 Initial Diagnosis   Screening mammogram showed right breast calcifications, spanning 2.0cm. 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    10/12/2020 Cancer Staging   Staging form: Breast, AJCC 8th Edition - Clinical stage from 10/12/2020: Stage 0 (cTis (DCIS), cN0, cM0, ER+, PR+) - Signed by Loa Socks, NP on 10/12/2020   12/22/2020 Surgery   Right lumpectomy Dwain Sarna): intermediate grade DCIS, 1.2cm, clear margins (COMET clinical trial)   01/2021 -  Anti-estrogen oral therapy   Adjuvant tamoxifen (Cindy Roman refused radiation)     CHIEF COMPLIANT: Surveillance of breast cancer  HISTORY OF PRESENT ILLNESS:   History of Present Illness   The Cindy Roman, with a history of breast cancer and Hashimoto's thyroiditis, presents with vertigo. The vertigo is positional, occurring when she lies down or looks down, and has been severe enough to cause her to sleep sitting up. She denies vertigo while sitting upright.  The Cindy Roman also reports fatigue, which has improved since starting thyroid medication for Hashimoto's thyroiditis. She is scheduled for a hysterectomy due to a two-year history of abnormal PAP smears, which have been non-cancerous. She is also on tamoxifen for breast cancer, which she tolerates well.         ALLERGIES:  is allergic to penicillins.  MEDICATIONS:  Current Outpatient Medications  Medication Sig Dispense Refill   estradiol (ESTRACE) 0.1 MG/GM vaginal  cream Place 0.5 g vaginally 2 (two) times a week. Place 0.5g nightly for two weeks then twice a week after (Cindy Roman not taking: Reported on 11/11/2023) 42.5 g 11   levothyroxine (SYNTHROID) 50 MCG tablet Take 50 mcg by mouth daily before breakfast.     Misc Natural Products (DAILY HERBS IMMUNE DEFENSE PO) Take by mouth. (Cindy Roman not taking: Reported on 11/11/2023)     Multiple Vitamins-Minerals (PRESERVISION AREDS PO) Take by mouth.     nirmatrelvir & ritonavir (PAXLOVID, 150/100,) 10 x 150 MG & 10 x 100MG  TBPK Take as directed twice daily for 5 days (Cindy Roman not taking: Reported on 10/11/2023) 20 tablet 0   Nutritional Supplements (JUICE PLUS FIBRE PO) Take by mouth.     tamoxifen (NOLVADEX) 20 MG tablet Take 1 tablet (20 mg total) by mouth daily. 90 tablet 3   No current facility-administered medications for this visit.    PHYSICAL EXAMINATION: ECOG PERFORMANCE STATUS: 1 - Symptomatic but completely ambulatory  Vitals:   11/11/23 0930  BP: 135/67  Pulse: 73  Resp: 18  Temp: 97.7 F (36.5 C)  SpO2: 99%   Filed Weights   11/11/23 0930  Weight: 187 lb 8 oz (85 kg)      LABORATORY DATA:  I have reviewed the data as listed    Latest Ref Rng & Units 05/03/2009    8:50 AM  CMP  Glucose 70 - 99 mg/dL 161   BUN 6 - 23 mg/dL 11   Creatinine 0.4 - 1.2 mg/dL 0.96   Sodium 045 - 409 mEq/L 138   Potassium 3.5 - 5.1 mEq/L 4.8  Chloride 96 - 112 mEq/L 105   CO2 19 - 32 mEq/L 27   Calcium 8.4 - 10.5 mg/dL 9.4   Total Protein 6.0 - 8.3 g/dL 6.7   Total Bilirubin 0.3 - 1.2 mg/dL 0.7   Alkaline Phos 39 - 117 U/L 69   AST 0 - 37 U/L 22   ALT 0 - 35 U/L 25     Lab Results  Component Value Date   WBC 5.7 05/03/2009   HGB 12.9 05/03/2009   HCT 38.5 05/03/2009   MCV 93.4 05/03/2009   PLT 174 05/03/2009   NEUTROABS 3.0 05/03/2009    ASSESSMENT & PLAN:  Ductal carcinoma in situ (DCIS) of right breast 12/22/2020: Right lumpectomy Dwain Sarna): Intermediate grade DCIS, 1.2 cm, clear  margins, ER 95%, PR 40% Tis NX stage 0   COMET clinical trial: Randomized to surgery   Treatment plan: 1. Cindy Roman refused adjuvant radiation 2. Adjuvant antiestrogen therapy with tamoxifen x5 years started February 2022   Tamoxifen toxicities:  Intermittent muscle cramps that wake her up at night: Mild   Slight fatigue: Possibly related to her thyroid function.  Being evaluated by her primary care physician. Abnormal Pap smear: Was prescribed estrogen cream.    Breast cancer surveillance: Mammogram 11/07/2023: Benign Density Cat B Breast exam 11/11/2023: Benign   Cindy Roman's daughter lives in [place]  Return to clinic in 6 months for follow-up   Breast Cancer Three years post-diagnosis, currently on Tamoxifen. No reported side effects. -Continue Tamoxifen daily. -Hold Tamoxifen starting today until one week post-surgery due to small risk of blood clots and decreased mobility post-surgery.  Hashimoto's Thyroiditis Recently started on thyroid medication, reports improvement in fatigue. -Continue current thyroid medication regimen.  Benign Paroxysmal Positional Vertigo (BPPV) Recent onset, exacerbated by certain head movements. -Immediate referral to physical therapy for potential Epley maneuver.  Upcoming Hysterectomy Scheduled for this Thursday. Cindy Roman has been having abnormal PAP smears for the last two years, but not cancerous. -Continue current plan with Dr. Lavella Hammock. -Post-surgery follow-up in six months.          Orders Placed This Encounter  Procedures   Ambulatory referral to Physical Therapy    Referral Priority:   Routine    Referral Type:   Physical Medicine    Referral Reason:   Specialty Services Required    Requested Specialty:   Physical Therapy    Number of Visits Requested:   1   The Cindy Roman has a good understanding of the overall plan. she agrees with it. she will call with any problems that may develop before the next visit here. Total  time spent: 30 mins including face to face time and time spent for planning, charting and co-ordination of care   Tamsen Meek, MD 11/11/23

## 2023-11-13 ENCOUNTER — Ambulatory Visit: Payer: Medicare Other | Admitting: Rehabilitative and Restorative Service Providers"

## 2023-11-13 ENCOUNTER — Encounter: Payer: Self-pay | Admitting: Rehabilitative and Restorative Service Providers"

## 2023-11-13 DIAGNOSIS — R42 Dizziness and giddiness: Secondary | ICD-10-CM | POA: Diagnosis not present

## 2023-11-13 DIAGNOSIS — R2689 Other abnormalities of gait and mobility: Secondary | ICD-10-CM

## 2023-11-13 DIAGNOSIS — M6281 Muscle weakness (generalized): Secondary | ICD-10-CM

## 2023-11-13 NOTE — Patient Instructions (Signed)

## 2023-11-13 NOTE — Therapy (Signed)
OUTPATIENT PHYSICAL THERAPY VESTIBULAR TREATMENT NOTE     Patient Name: Cindy Roman MRN: [id number] DOB:24-Nov-1956, 67 y.o., female Today's Date: 11/13/2023  END OF SESSION:  PT End of Session - 11/13/23 0852     Visit Number 2    Date for PT Re-Evaluation 12/20/23    Authorization Type Medicare    Progress Note Due on Visit 10    PT Start Time 0850    PT Stop Time 0930    PT Time Calculation (min) 40 min    Activity Tolerance Patient tolerated treatment well    Behavior During Therapy [facility] for tasks assessed/performed             Past Medical History:  Diagnosis Date   Breast cancer (HCC) 2021   COVID 12/05/2020   Past Surgical History:  Procedure Laterality Date   ANKLE SURGERY     BREAST BIOPSY Left 2003   BREAST LUMPECTOMY Right 12/22/2020   DCIS   BREAST LUMPECTOMY WITH RADIOACTIVE SEED LOCALIZATION Right 12/22/2020   Procedure: RIGHT BREAST LUMPECTOMY WITH RADIOACTIVE SEED LOCALIZATION;  Surgeon: Emelia Loron, MD;  Location: [facility];  Service: General;  Laterality: Right;   CHOLECYSTECTOMY     WRIST SURGERY     Patient Active Problem List   Diagnosis Date Noted   Genital herpes simplex 06/08/2021   Ductal carcinoma in situ (DCIS) of right breast 10/12/2020   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   Left knee pain 04/24/2018   Toe ulcer, left, limited to breakdown of skin (HCC) 04/24/2018   Unilateral primary osteoarthritis, right hip 05/20/2017   Unilateral primary osteoarthritis, right knee 05/20/2017    PCP: Merri Brunette, MD REFERRING PROVIDER: Serena Croissant, MD  REFERRING DIAG: H81.10 (ICD-10-CM) - Benign paroxysmal positional vertigo, unspecified laterality   THERAPY DIAG:  Dizziness and giddiness  Balance problem  Muscle weakness (generalized)  ONSET DATE: 11/09/2023  Rationale for Evaluation and Treatment: Rehabilitation  SUBJECTIVE:   SUBJECTIVE STATEMENT: Patient reports that her dizziness  is feeling better.  States that she was able to sleep in her bed on Monday night and only had minimal dizziness.  However, she is having some increased cervical pain today secondary to sleeping in a chair last night.  Pt accompanied by: self  PERTINENT HISTORY:  Hysterectomy planned for 11/14/2023 Ductal carcinoma in situ (DCIS) of right breast: Is in a study in which she did not have radiation, only had surgery and Tomoxifen ; cholecystectomy   PAIN:  Are you having pain? Yes: NPRS scale: 6/10 Pain location: cervical Pain description: foggy and aching Aggravating factors: unknown Relieving factors: unknown  PRECAUTIONS: None  RED FLAGS: None   WEIGHT BEARING RESTRICTIONS: No  FALLS: Has patient fallen in last 6 months? No  LIVING ENVIRONMENT: Lives with: lives with their spouse Lives in: House/apartment Stairs:  one level home Has following equipment at home: None  PLOF: Independent and Leisure: travel, go for walks  PATIENT GOALS: To no longer be dizzy  OBJECTIVE:  Note: Objective measures were completed at Evaluation unless otherwise noted.  DIAGNOSTIC FINDINGS: n/a  COGNITION: Overall cognitive status: Within functional limits for tasks assessed   SENSATION: WFL  POSTURE:  No Significant postural limitations  Cervical ROM:    WFL, but some stiffness reported  STRENGTH: WFL   FUNCTIONAL TESTS:  Eval:  MCTSIB: Condition 1: Avg of 3 trials: 30 sec, Condition 2: Avg of 3 trials: 30 sec, Condition 3: Avg of 3 trials: 30 sec, Condition 4:  Avg of 3 trials: 20 sec, and Total Score: 110/120 Single leg Stance:  right- 26.08 sec, left- 24.33 sec  PATIENT SURVEYS:  Eval:  ABC scale 1360 / 1600 = 85.0 %  VESTIBULAR ASSESSMENT:   SYMPTOM BEHAVIOR:  Subjective history: Pt reports symptoms started Saturday night when lying down.  Symptoms come when lying down or bending forward.  Non-Vestibular symptoms: headaches  Type of dizziness:  Spinning/Vertigo  Frequency: daily  Duration: until she sits up  Aggravating factors: Induced by position change: lying supine  Relieving factors:  sitting up  Progression of symptoms: unchanged  OCULOMOTOR EXAM:  Ocular Alignment: normal  Ocular ROM: No Limitations  Spontaneous Nystagmus: absent  Gaze-Induced Nystagmus: absent  Smooth Pursuits: intact  Saccades: intact  Convergence/Divergence: WFL  VESTIBULAR - OCULAR REFLEX:   Head thrust for 1-2 beats of nystagmus to bilat sides   POSITIONAL TESTING: Right Dix-Hallpike: upbeating, left nystagmus Left Dix-Hallpike: nystagmus initially when going into supine position, but stopped quickly with having head towards left side (in position of test)     OTHOSTATICS: not done   VESTIBULAR TREATMENT:                                                                                                    DATE: 11/13/2023 Trigger Point Dry-Needling  Treatment instructions: Expect mild to moderate muscle soreness. S/S of pneumothorax if dry needled over a lung field, and to seek immediate medical attention should they occur. Patient verbalized understanding of these instructions and education. Patient Consent Given: Yes Education handout provided: Yes Muscles treated: bilateral cervical  Electrical stimulation performed: No Parameters: N/A Treatment response/outcome: Utilized skilled palpation to identify bony landmarks and trigger points.  Able to illicit twitch response and muscle elongation.  Soft tissue mobilization following to further promote tissue elongation.   Dix-Hallpike positive on right side, proceeded with canalith repositioning x2 for right side with no symptoms on 2nd maneuver Manual:  soft tissue mobilization to bilateral cervical paraspinals and upper traps to promote further tissue elongation and decreased pain Seated bilateral upper trap stretch 2x20 sec bilat Seated levator stretch 2x20 sec bilat Single leg stance 2x30  sec bilat with UE support of barre as needed Tandem stance on foam pad 2x20 sec bilat with UE support of barre as needed Counter L stretch x20 sec    DATE: 11/11/2023  Canalith Repositioning:  Epley Right: Number of Reps: 2 and Response to Treatment: symptoms improved Habituation:  Brandt-Daroff: comment: provided for HEP and demonstrated to patient   PATIENT EDUCATION: Education details: Issued HEP Person educated: Patient Education method: Explanation, Demonstration, and Handouts Education comprehension: verbalized understanding  HOME EXERCISE PROGRAM: Access Code: [email or web address] URL: [email or web address] Date: 11/13/2023 Prepared by: Reather Laurence  Exercises - Brandt-Daroff Vestibular Exercise  - 1 x daily - 7 x weekly - 5 reps - Self-Epley Maneuver Right Ear  - 1 x daily - 7 x weekly - 1-2 reps - Seated Scapular Retraction  - 1 x daily - 7 x weekly - 2 sets - 10 reps - Seated Upper Trapezius Stretch  - 1 x  daily - 7 x weekly - 2 reps - 20 sec hold - Seated Levator Scapulae Stretch  - 1 x daily - 7 x weekly - 2 reps - 20 sec hold - Standing 'L' Stretch at Counter  - 1 x daily - 7 x weekly - 2 reps - 20 sec hold  Patient Education - What Is BPPV?  GOALS: Goals reviewed with patient? Yes  SHORT TERM GOALS: Target date: 11/22/2023  Pt will be independent with initial HEP. Baseline: Goal status: MET on 11/13/23   LONG TERM GOALS: Target date: 12/20/2023  Pt will be independent with advanced HEP. Baseline:  Goal status: IN PROGRESS  2.  Patient will have negative Gilberto Better during last PT session. Baseline:  Goal status: IN PROGRESS  3.  Patient will report at least an 80% improvement with dizziness and balance with all activities, including lying flat. Baseline:  Goal status: IN PROGRESS  4.  Patient will have single leg stance of greater than 30 seconds bilat and modified CTSIB of 120/120 to decrease risk of falling. Baseline:  Goal status:  INITIAL   ASSESSMENT:  CLINICAL IMPRESSION: Ms Chanda is presenting to skilled PT reporting that her dizziness is improving.  Patient states that she was fearful of sleeping in her bed last night, so she slept in the chair and is subsequently having increased cervical pain today.  Patient does admit that her headache has decreased since initial evaluation.  Patient with less nystagmus noted with Dix-Hallpike and first canalith repositioning with Epley Maneuver today, than compared to initial evaluation.  Additionally, patient without any dizziness or nystagmus noted on second Epley Maneuver.  Patient able to perform all portions of exercise following canalith repositioning without any dizziness reported.  Patient educated on the importance of sleep and sleeping in her bed, as the dizziness should be gone at this time, she verbalizes understanding.  Patient educated to not perform vestibular exercises immediately following surgery and to only perform when surgeon advises that she can resume exercise, pt verbalizes understanding.  OBJECTIVE IMPAIRMENTS: decreased balance and dizziness.   ACTIVITY LIMITATIONS: sleeping  PARTICIPATION LIMITATIONS:  lying supine and procedures that require lying supine  PERSONAL FACTORS: 1 comorbidity: Hx of Breast Cancer, schedule hysterectomy on 11/14/23  are also affecting patient's functional outcome.   REHAB POTENTIAL: Good  CLINICAL DECISION MAKING: Stable/uncomplicated  EVALUATION COMPLEXITY: Low   PLAN:  PT FREQUENCY: 1-2x/week  PT DURATION: 6 weeks  PLANNED INTERVENTIONS: 97164- PT Re-evaluation, 97110-Therapeutic exercises, 97530- Therapeutic activity, 97112- Neuromuscular re-education, 97535- Self Care, 16109- Manual therapy, [phone]- Gait training, [phone]- Canalith repositioning, [id number]- Aquatic Therapy, 97014- Electrical stimulation (unattended), [id number]- Electrical stimulation (manual), 97035- Ultrasound, Patient/Family education, Balance training,  Stair training, Taping, Dry Needling, Vestibular training, Cryotherapy, and Moist heat  PLAN FOR NEXT SESSION: Gilberto Better and canalith repositioning as indicated, vestibular rehab, dry needling if indicated for headache    Reather Laurence, PT, DPT 11/13/23, 11:25 AM  [facility] [address], [address] [place], [place] [address] Phone # [phone] Fax [phone]

## 2023-12-10 ENCOUNTER — Ambulatory Visit
Payer: Medicare Other | Attending: Hematology and Oncology | Admitting: Rehabilitative and Restorative Service Providers"

## 2023-12-17 ENCOUNTER — Encounter: Payer: No Typology Code available for payment source | Admitting: Rehabilitative and Restorative Service Providers"

## 2023-12-26 ENCOUNTER — Other Ambulatory Visit: Payer: Self-pay | Admitting: Hematology and Oncology

## 2024-05-08 ENCOUNTER — Telehealth: Payer: Self-pay | Admitting: *Deleted

## 2024-05-08 NOTE — Telephone Encounter (Signed)
 Received call from pt stating she tested positive for Covid this week and needs to push out yearly f/u. Message sent to scheduling team.

## 2024-05-08 NOTE — Telephone Encounter (Signed)
 AFT - 25: COMPARING AN OPERATION TO MONITORING, WITH OR WITHOUT ENDOCRINE THERAPY (COMET) FOR LOW RISK DCIS: A PHASE III PROSPECTIVE RANDOMIZED TRIAL    This nurse called the pt to remind her regarding her month 42 visit on 05/11/24 for the COMET study.  The pt said that she recently returned home from traveling in [place].  She said that she was not feeling well on Wednesday, and she has tested positive for COVID.  She said that she was prescribed paxlovid  today.  Dr. Alix Aquas nurse stated that the pt would need to re-schedule her follow up appt for 3 weeks later to allow the patient time to recover from her COVID infection.  The pt was informed that her 05/11/24 appt will be canceled, and the nurse has requested the scheduler to move her appt out 3 weeks.  The pt appreciated the nurse's call.  This nurse will contact the pt once her appt has been rescheduled.    Elfida Grinder RN, BSN, CCRP Clinical Research Nurse Lead 05/08/2024 3:52 PM

## 2024-05-11 ENCOUNTER — Other Ambulatory Visit: Payer: No Typology Code available for payment source

## 2024-05-11 ENCOUNTER — Ambulatory Visit: Payer: No Typology Code available for payment source | Admitting: Hematology and Oncology

## 2024-06-01 NOTE — Assessment & Plan Note (Signed)
 12/22/2020: Right lumpectomy Cindy Roman): Intermediate grade DCIS, 1.2 cm, clear margins, ER 95%, PR 40% Tis NX stage 0   COMET clinical trial: Randomized to surgery   Treatment plan: 1. Patient refused adjuvant radiation 2. Adjuvant antiestrogen therapy with tamoxifen  x5 years started February 2022   Tamoxifen  toxicities:  Intermittent muscle cramps that wake her up at night: Mild   Slight fatigue: Possibly related to her thyroid function.  Being evaluated by her primary care physician. Abnormal Pap smear: Was prescribed estrogen cream.    Breast cancer surveillance: Mammogram 11/07/2023: Benign Density Cat B Breast exam 06/02/24: Benign   Patient's daughter lives in [place]  Return to clinic in 1 year for follow-up

## 2024-06-02 ENCOUNTER — Other Ambulatory Visit: Payer: Self-pay | Admitting: *Deleted

## 2024-06-02 ENCOUNTER — Inpatient Hospital Stay: Attending: Hematology and Oncology | Admitting: Hematology and Oncology

## 2024-06-02 ENCOUNTER — Encounter: Payer: Self-pay | Admitting: *Deleted

## 2024-06-02 VITALS — BP 132/71 | HR 67 | Temp 98.2°F | Resp 16 | Ht 67.0 in | Wt 182.8 lb

## 2024-06-02 DIAGNOSIS — D0511 Intraductal carcinoma in situ of right breast: Secondary | ICD-10-CM

## 2024-06-02 DIAGNOSIS — Z006 Encounter for examination for normal comparison and control in clinical research program: Secondary | ICD-10-CM | POA: Diagnosis not present

## 2024-06-02 NOTE — Research (Signed)
 AFT - 25: COMPARING AN OPERATION TO MONITORING, WITH OR WITHOUT ENDOCRINE THERAPY (COMET) FOR LOW RISK DCIS: A PHASE III PROSPECTIVE RANDOMIZED TRIAL   42- month study visit    The pt was into the cancer center this morning unaccompanied for her month 42 study visit.  The pt states she recently traveled on a [place] cruise down the [employer]. She confirmed that she is still taking tamoxifen  every day.  She said that she did test positive for COVID-19 after returning home from her cruise.  She said that she was symptomatic on a few days, and she took Paxlovid  with good relief of her symptoms.  Her visit was delayed 3 weeks to allow for recovery.  This nurse confirmed with the the study today that no late reporting is required since her re-scheduled visit was completed in the 4 week window allowed by the study.    Research labs - No research samples are required at this visit.     Mammogram: No imaging is required at this visit.     Questionnaires: No questionnaires are required at this visit.     Solicited and Other/Adverse Events: Reviewed with patient and documented below.  The pt confirmed that she has moderate arthralgia, mainly her right hip (grade 2).  She denies myalgia (leg cramps) since she began taking magnesium. She also reports mild hot flashes (grade 1).  Research nurse obtained the following attributions from Dr. Odean after the pt's visit on 06/02/24.    Event Grade Onset Date Resolved Date Drug Name Attribution Treatment Comments  Arthralgia  Grade 2 11/12/22 Ongoing  Tamoxifen  Possible   None reported  Mainly right hip pain.   Hot flashes Grade 1 11/11/23 Ongoing  Tamoxifen  Possible  None reported  Mild, per patient report  Upper respiratory infection Covid-19 Grade 2 05/06/2024 06/02/2024 Tamoxifen  Unrelated Took Paxlovid  with good relief of symptoms    Not evaluated (N/E)Solicited AE's include the following:  osteoporosis and cholesterol Pt denied the following Solicited AE's:   allergic reaction, fever, myalgia, acute coronary syndrome, ischemia cerebrovascular, hypertension, nausea, and fracture   Plan: Mrs. Strnad will be scheduled to return in approximately 6 months for MD visit in December 2025.  Pt's bilateral mammogram has been ordered for December 2025.   I thanked patient for her continued participation and support of study.    Levon FREDRIK Sandifer RN, BSN, CCRP Clinical Research Nurse Lead 06/02/2024 12:15 PM

## 2024-06-02 NOTE — Progress Notes (Signed)
 Patient Care Team: Claudene Pellet, MD as PCP - General (Family Medicine) Glean Stephane BROCKS, RN (Inactive) as Oncology Nurse Navigator Tyree Nanetta SAILOR, RN as Oncology Nurse Navigator  DIAGNOSIS:  Encounter Diagnosis  Name Primary?   Ductal carcinoma in situ (DCIS) of right breast Yes    SUMMARY OF ONCOLOGIC HISTORY: Oncology History  Ductal carcinoma in situ (DCIS) of right breast  10/12/2020 Initial Diagnosis   Screening mammogram showed right breast calcifications, spanning 2.0cm. 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    10/12/2020 Cancer Staging   Staging form: Breast, AJCC 8th Edition - Clinical stage from 10/12/2020: Stage 0 (cTis (DCIS), cN0, cM0, ER+, PR+) - Signed by Crawford Morna Pickle, NP on 10/12/2020   12/22/2020 Surgery   Right lumpectomy Viktoria): intermediate grade DCIS, 1.2cm, clear margins (COMET clinical trial)   01/2021 -  Anti-estrogen oral therapy   Adjuvant tamoxifen  (patient refused radiation)     CHIEF COMPLIANT: Follow-up of DCIS on Comet study  HISTORY OF PRESENT ILLNESS:   History of Present Illness DAIANNA Roman is a 68 year old female with breast cancer who presents for follow-up regarding her ongoing treatment with tamoxifen .  She has been on tamoxifen  for three and a half years with one and a half years remaining. She experiences sore hips, which improve with movement and stretching. No severe hot flashes are present. Muscle cramps occur at night, and she takes magnesium daily, which effectively prevents them. Her last mammogram in December was satisfactory. She recently traveled to [place] and contracted COVID-19.     ALLERGIES:  is allergic to penicillins.  MEDICATIONS:  Current Outpatient Medications  Medication Sig Dispense Refill   levothyroxine (SYNTHROID) 50 MCG tablet Take 50 mcg by mouth daily before breakfast.     Multiple Vitamins-Minerals (PRESERVISION AREDS PO)  Take by mouth.     Nutritional Supplements (JUICE PLUS FIBRE PO) Take by mouth.     tamoxifen  (NOLVADEX ) 20 MG tablet Take 1 tablet (20 mg total) by mouth daily. 90 tablet 3   Misc Natural Products (DAILY HERBS IMMUNE DEFENSE PO) Take by mouth. (Patient not taking: Reported on 06/02/2024)     nirmatrelvir  & ritonavir  (PAXLOVID , 150/100,) 10 x 150 MG & 10 x 100MG  TBPK Take as directed twice daily for 5 days (Patient not taking: Reported on 06/02/2024) 20 tablet 0   No current facility-administered medications for this visit.    PHYSICAL EXAMINATION: ECOG PERFORMANCE STATUS: 1 - Symptomatic but completely ambulatory  Vitals:   06/02/24 0941  BP: 132/71  Pulse: 67  Resp: 16  Temp: 98.2 F (36.8 C)  SpO2: 99%   Filed Weights   06/02/24 0941  Weight: 182 lb 12.8 oz (82.9 kg)    Physical Exam No palpable lumps or nodules in bilateral breasts or axilla  (exam performed in the presence of a chaperone)  LABORATORY DATA:  I have reviewed the data as listed    Latest Ref Rng & Units 05/03/2009    8:50 AM  CMP  Glucose 70 - 99 mg/dL 869   BUN 6 - 23 mg/dL 11   Creatinine 0.4 - 1.2 mg/dL 9.18   Sodium 864 - 854 mEq/L 138   Potassium 3.5 - 5.1 mEq/L 4.8   Chloride 96 - 112 mEq/L 105   CO2 19 - 32 mEq/L 27   Calcium 8.4 - 10.5 mg/dL 9.4   Total Protein 6.0 - 8.3 g/dL 6.7  Total Bilirubin 0.3 - 1.2 mg/dL 0.7   Alkaline Phos 39 - 117 U/L 69   AST 0 - 37 U/L 22   ALT 0 - 35 U/L 25     Lab Results  Component Value Date   WBC 5.7 05/03/2009   HGB 12.9 05/03/2009   HCT 38.5 05/03/2009   MCV 93.4 05/03/2009   PLT 174 05/03/2009   NEUTROABS 3.0 05/03/2009    ASSESSMENT & PLAN:  Ductal carcinoma in situ (DCIS) of right breast 12/22/2020: Right lumpectomy Viktoria): Intermediate grade DCIS, 1.2 cm, clear margins, ER 95%, PR 40% Tis NX stage 0   COMET clinical trial: Randomized to surgery   Treatment plan: 1. Patient refused adjuvant radiation 2. Adjuvant antiestrogen therapy  with tamoxifen  x5 years started February 2022   Tamoxifen  toxicities:  Intermittent muscle cramps that wake her up at night: Resolved after taking magnesium supplement   Slight fatigue: Secondary to hypothyroidism Abnormal Pap smear: Was prescribed estrogen cream.    Breast cancer surveillance: Mammogram 11/07/2023: Benign Density Cat B Breast exam 06/02/24: Benign   Patient's daughter lives in [place]  Return to clinic in Dec for follow-up after mammograms. ------------------------------------- Assessment and Plan Assessment & Plan Ductal carcinoma in situ (DCIS) of right breast Under Tamoxifen  therapy for 3.5 years with 1.5 years remaining. Hip soreness possibly due to Tamoxifen  or menopause. Recent mammogram satisfactory. Ovaries nonfunctional post-hysterectomy, no increased breast cancer risk. - Continue Tamoxifen  therapy for 1.5 more years. - Perform breast exam today. - Schedule mammogram for December 5th. - Schedule follow-up appointment for December 15th.      No orders of the defined types were placed in this encounter.  The patient has a good understanding of the overall plan. she agrees with it. she will call with any problems that may develop before the next visit here. Total time spent: 30 mins including face to face time and time spent for planning, charting and co-ordination of care   Viinay K Shanita Kanan, MD 06/02/24

## 2024-06-29 NOTE — Progress Notes (Shared)
 [facility] - Clinic Note  07/10/2024     CHIEF COMPLAINT Patient presents for No chief complaint on file.   HISTORY OF PRESENT ILLNESS: Cindy Roman is a 68 y.o. female who presents to the clinic today for:     Pt states vision is the same, she sees a floater every once in awhile   Referring physician: Claudene Pellet, MD [phone] [address] [place],  [place] [address]  HISTORICAL INFORMATION:   Selected notes from the MEDICAL RECORD NUMBER Referred by Dr. Cleatus for maculopathy OS LEE:  Ocular Hx- PMH-    CURRENT MEDICATIONS: No current outpatient medications on file. (Ophthalmic Drugs)   No current facility-administered medications for this visit. (Ophthalmic Drugs)   Current Outpatient Medications (Other)  Medication Sig   levothyroxine (SYNTHROID) 50 MCG tablet Take 50 mcg by mouth daily before breakfast.   Misc Natural Products (DAILY HERBS IMMUNE DEFENSE PO) Take by mouth. (Patient not taking: Reported on 06/02/2024)   Multiple Vitamins-Minerals (PRESERVISION AREDS PO) Take by mouth.   nirmatrelvir  & ritonavir  (PAXLOVID , 150/100,) 10 x 150 MG & 10 x 100MG  TBPK Take as directed twice daily for 5 days (Patient not taking: Reported on 06/02/2024)   Nutritional Supplements (JUICE PLUS FIBRE PO) Take by mouth.   tamoxifen  (NOLVADEX ) 20 MG tablet Take 1 tablet (20 mg total) by mouth daily.   No current facility-administered medications for this visit. 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   BREAST LUMPECTOMY WITH RADIOACTIVE SEED LOCALIZATION Right 12/22/2020   Procedure: RIGHT BREAST  LUMPECTOMY WITH RADIOACTIVE SEED LOCALIZATION;  Surgeon: Ebbie Cough, MD;  Location: [facility];  Service: General;  Laterality: Right;   CHOLECYSTECTOMY     WRIST SURGERY     FAMILY HISTORY Family History  Problem Relation Age of Onset   Diabetes Mother    Breast cancer Maternal Aunt    SOCIAL HISTORY Social History   Tobacco Use   Smoking status: Never   Smokeless tobacco: Never  Vaping Use   Vaping status: Never Used  Substance Use Topics   Alcohol use: Yes    Comment: couple glasses of wine a day   Drug use: No       OPHTHALMIC EXAM:  Not recorded    IMAGING AND PROCEDURES  Imaging and Procedures for 07/10/2024          ASSESSMENT/PLAN:    ICD-10-CM   1. Intermediate stage nonexudative age-related macular degeneration of left eye  H35.3122     2. 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 Combined forms of age-related cataract of both eyes  H25.813         1,2. Age related macular degeneration, non-exudative, both eyes (OS > OD) - pt reports history of being diagnosed with macular degeneration in 3rd grade - BCVA OD 20/30, OS 20/40 -- stable OU - exam shows significant drusen and RPE atrophy in posterior pole OS, while OD has minimal drusen - OCT shows OD: Partial PVD, focal peripapillary  PED / [facility] caught on widefield -- no fluid, stable; OS: Partial PVD, focal peripapillary PED / [facility] caught on widefield -- no fluid, stable, +foveal notch - FA 2.6.24 shows staining OS, but no CNV; OD normal study  - Recommend amsler grid monitoring  - differential includes macular dystrophy OS - no retinal or ophthalmic interventions indicated or recommended at this time  - f/u 9 months, sooner prn - DFE, OCT  3. Mixed Cataract OU - The symptoms of cataract, surgical options, and treatments and risks were discussed with patient. - discussed diagnosis and progression - under the expert care of  [facility]  Ophthalmic Meds Ordered this visit:  No orders of the defined types were placed in this encounter.    No follow-ups on file.  There are no Patient Instructions on file for this visit.   This document serves as a record of services personally performed by Redell JUDITHANN Hans, MD, PhD. It was created on their behalf by Avelina Pereyra, COA an ophthalmic technician. The creation of this record is the provider's dictation and/or activities during the visit.   Electronically signed by: Avelina GORMAN Pereyra, COT  06/29/24  4:14 PM    Redell JUDITHANN Hans, M.D., Ph.D. Diseases & Surgery of the Retina and Vitreous [facility]    Abbreviations: M myopia (nearsighted); A astigmatism; H hyperopia (farsighted); P presbyopia; Mrx spectacle prescription;  CTL contact lenses; OD right eye; OS left eye; OU both eyes  XT exotropia; ET esotropia; PEK punctate epithelial keratitis; PEE punctate epithelial erosions; DES dry eye syndrome; MGD meibomian gland dysfunction; ATs artificial tears; PFAT's preservative free artificial tears; NSC nuclear sclerotic cataract; PSC posterior subcapsular cataract; ERM epi-retinal membrane; PVD posterior vitreous detachment; RD retinal detachment; DM diabetes mellitus; DR diabetic retinopathy; NPDR non-proliferative diabetic retinopathy; PDR proliferative diabetic retinopathy; CSME clinically significant macular edema; DME diabetic macular edema; dbh dot blot hemorrhages; CWS cotton wool spot; POAG primary open angle glaucoma; C/D cup-to-disc ratio; HVF humphrey visual field; GVF goldmann visual field; OCT optical coherence tomography; IOP intraocular pressure; BRVO Branch retinal vein occlusion; CRVO central retinal vein occlusion; CRAO central retinal artery occlusion; BRAO branch retinal artery occlusion; RT retinal tear; SB scleral buckle; PPV pars plana vitrectomy; VH Vitreous hemorrhage; PRP panretinal laser photocoagulation; IVK intravitreal  kenalog; VMT vitreomacular traction; MH Macular hole;  NVD neovascularization of the disc; NVE neovascularization elsewhere; AREDS age related eye disease study; ARMD age related macular degeneration; POAG primary open angle glaucoma; EBMD epithelial/anterior basement membrane dystrophy; ACIOL anterior chamber intraocular lens; IOL intraocular lens; PCIOL posterior chamber intraocular lens; Phaco/IOL phacoemulsification with intraocular lens placement; PRK photorefractive keratectomy; LASIK laser assisted in situ keratomileusis; HTN hypertension; DM diabetes mellitus; COPD chronic obstructive pulmonary disease

## 2024-07-10 ENCOUNTER — Encounter (INDEPENDENT_AMBULATORY_CARE_PROVIDER_SITE_OTHER): Payer: Medicare Other | Admitting: Ophthalmology

## 2024-07-10 DIAGNOSIS — H353122 Nonexudative age-related macular degeneration, left eye, intermediate dry stage: Secondary | ICD-10-CM

## 2024-07-10 DIAGNOSIS — H25813 Combined forms of age-related cataract, bilateral: Secondary | ICD-10-CM

## 2024-07-10 DIAGNOSIS — H353111 Nonexudative age-related macular degeneration, right eye, early dry stage: Secondary | ICD-10-CM

## 2024-07-28 NOTE — Progress Notes (Signed)
 [facility] - Clinic Note  08/05/2024     CHIEF COMPLAINT Patient presents for Retina Follow Up   HISTORY OF PRESENT ILLNESS: Cindy Roman is a 68 y.o. female who presents to the clinic today for:   HPI     Retina Follow Up   Patient presents with  Dry AMD.  In both eyes.  This started 19 months ago.  Severity is moderate.  Duration of 10 months.  Since onset it is stable.  I, the attending physician,  performed the HPI with the patient and updated documentation appropriately.        Comments   Pt states she has noticed is is getting more difficult to read menu boards. Pt denies issues with glare but does have night time driving glasses that she has not tried out yet. Pt states she has 2 floaters in the central vision of her right eye that are most noticeable with her eyes closed. Pt denies FOL/pain. Pt has not been monitoring the amsler grid but does look at her tile floor to see if any lines are going crooked. Pt does not use ats.      Last edited by Valdemar Rogue, MD on 08/09/2024  8:32 PM.    Pt states the vision is the same but at times she notices the reading is difficult. She states that she is using just readers.   Referring physician: Claudene Pellet, MD [phone] [place],  [place] [address]  HISTORICAL INFORMATION:   Selected notes from the MEDICAL RECORD NUMBER Referred by Dr. Cleatus for maculopathy OS LEE:  Ocular Hx- PMH-    CURRENT MEDICATIONS: No current outpatient medications on file. (Ophthalmic Drugs)   No current facility-administered medications for this visit. (Ophthalmic Drugs)   Current Outpatient Medications (Other)  Medication Sig   levothyroxine (SYNTHROID) 50 MCG tablet Take 50 mcg by mouth daily before breakfast.   Misc Natural Products (DAILY HERBS IMMUNE DEFENSE PO) Take by mouth. (Patient not taking: Reported on 06/02/2024)   Multiple Vitamins-Minerals (PRESERVISION AREDS PO) Take by mouth.    nirmatrelvir  & ritonavir  (PAXLOVID , 150/100,) 10 x 150 MG & 10 x 100MG  TBPK Take as directed twice daily for 5 days (Patient not taking: Reported on 06/02/2024)   Nutritional Supplements (JUICE PLUS FIBRE PO) Take by mouth.   tamoxifen  (NOLVADEX ) 20 MG tablet Take 1 tablet (20 mg total) by mouth daily.   No current facility-administered medications for this visit. (Other)   REVIEW OF SYSTEMS: ROS   Positive for: Eyes Last edited by Elnor Avelina RAMAN, COT on 08/05/2024  7:55 AM.     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   BREAST LUMPECTOMY WITH RADIOACTIVE SEED LOCALIZATION Right 12/22/2020   Procedure: RIGHT BREAST LUMPECTOMY WITH RADIOACTIVE SEED LOCALIZATION;  Surgeon: Ebbie Cough, MD;  Location: [facility];  Service: General;  Laterality: Right;   CHOLECYSTECTOMY     WRIST SURGERY     FAMILY HISTORY Family History  Problem Relation Age of Onset   Diabetes Mother    Breast cancer Maternal Aunt    SOCIAL HISTORY Social History  Tobacco Use   Smoking status: Never   Smokeless tobacco: Never  Vaping Use   Vaping status: Never Used  Substance Use Topics   Alcohol use: Yes    Comment: couple glasses of wine a day   Drug use: No       OPHTHALMIC EXAM:  Base Eye Exam     Visual Acuity (Snellen - Linear)       Right Left   Dist [place] 20/30 -1 20/40 +2   Dist ph [place] NI NI         Tonometry (Tonopen, 8:00 AM)       Right Left   Pressure 21 20         Pupils       Pupils Dark Light Shape React APD   Right PERRL 3 2 Round Brisk None   Left PERRL 3 2 Round Brisk None         Visual Fields       Left Right    Full Full          Extraocular Movement       Right Left    Full, Ortho Full, Ortho         Neuro/Psych     Oriented x3: Yes   Mood/Affect: Normal         Dilation     Both eyes: 1.0% Mydriacyl, 2.5% Phenylephrine @ 8:01 AM           Slit Lamp and Fundus Exam     Slit Lamp Exam       Right Left   Lids/Lashes Dermatochalasis - upper lid Dermatochalasis - upper lid   Conjunctiva/Sclera White and quiet White and quiet   Cornea trace Debris in tear film Trace PEE, tear film debris   Anterior Chamber deep and clear deep and clear   Iris Round and dilated Round and dilated   Lens 2+ Nuclear sclerosis, 2-3+ Cortical cataract 2+ Nuclear sclerosis, 2-3+ Cortical cataract   Anterior Vitreous Vitreous syneresis Vitreous syneresis         Fundus Exam       Right Left   Disc Pink and Sharp, temporal PPA/PPP Pink and Sharp, mild PPP   C/D Ratio 0.3 0.3   Macula Flat, Good foveal reflex, RPE mottling, rare drusen; No heme or edema Flat, Good foveal reflex, RPE mottling and clumping, drusen, No heme or edema   Vessels attenuated, mild tortuosity attenuated, mild tortuosity   Periphery Attached, peripheral cystoid degeneration, No heme Attached, mild reticular degeneration, No heme           IMAGING AND PROCEDURES  Imaging and Procedures for 08/05/2024  OCT, Retina - OU - Both Eyes       Right Eye Quality was good. Central Foveal Thickness: 272. Progression has been stable. Findings include normal foveal contour, no IRF, no SRF, retinal drusen (Partial PVD, focal peripapillary PED / [facility] caught on widefield -- no fluid, stable).   Left Eye Quality was good. Central Foveal Thickness: 269. Progression has been stable. Findings include normal foveal contour, no IRF, no SRF, retinal drusen (Partial PVD, focal peripapillary PED / [facility] caught on widefield -- no fluid, stable, foveal notch).   Notes *Images captured and stored on drive  Diagnosis / Impression:  Nonexudative ARMD OU (OS  > OD) OD: Partial PVD, focal peripapillary PED / [facility] caught on widefield -- no fluid, stable OS: Partial PVD, focal peripapillary PED / [facility] caught on widefield -- no  fluid, stable, +foveal notch  Clinical management:  See below  Abbreviations: NFP - Normal foveal profile. CME - cystoid macular edema. PED - pigment epithelial detachment. IRF - intraretinal fluid. SRF - subretinal fluid. EZ - ellipsoid zone. ERM - epiretinal membrane. ORA - outer retinal atrophy. ORT - outer retinal tubulation. SRHM - subretinal hyper-reflective material. IRHM - intraretinal hyper-reflective material            ASSESSMENT/PLAN:    ICD-10-CM   1. Intermediate stage nonexudative age-related macular degeneration of left eye  H35.3122 OCT, Retina - OU - Both Eyes    2. 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 Combined forms of age-related cataract of both eyes  H25.813      1,2. Age related macular degeneration, non-exudative, both eyes (OS > OD) - pt reports history of being diagnosed with macular degeneration in 3rd grade - BCVA OD 20/30, OS 20/40 -- stable OU - exam shows significant drusen and RPE atrophy in posterior pole OS, while OD has minimal drusen - OCT shows OD: Partial PVD, focal peripapillary PED / [facility] caught on widefield -- no fluid, stable; OS: Partial PVD, focal peripapillary PED / [facility] caught on widefield -- no fluid, stable, +foveal notch - FA 2.6.24 shows staining OS, but no CNV; OD normal study  - Recommend amsler grid monitoring  - differential includes macular dystrophy OS - no retinal or ophthalmic interventions indicated or recommended at this time  - f/u 9-12 months, sooner prn - DFE, OCT  3. Mixed Cataract OU - The symptoms of cataract, surgical options, and treatments and risks were discussed with patient. - discussed diagnosis and progression - under the expert care of [facility]  Ophthalmic Meds Ordered this visit:  No orders of the defined types were placed in this encounter.    Return in about 1 year (around 08/05/2025) for f/u Non Ex AMD OU, DFE, OCT.  There are no Patient Instructions on file for this visit.   This document serves as a record of services personally performed by Redell JUDITHANN Hans, MD, PhD. It was created on their behalf by Almetta Pesa, an ophthalmic technician. The creation of this record is the provider's dictation and/or activities during the visit.    Electronically signed by: Almetta Pesa, OA, 08/09/24  8:35 PM  This document serves as a record of services personally performed by Redell JUDITHANN Hans, MD, PhD. It was created on their behalf by Wanda GEANNIE Keens, COT an ophthalmic technician. The creation of this record is the provider's dictation and/or activities during the visit.    Electronically signed by:  Wanda GEANNIE Keens, COT  08/09/24 8:35 PM   Redell JUDITHANN Hans, M.D., Ph.D. Diseases & Surgery of the Retina and Vitreous [facility]  I have reviewed the above documentation for accuracy and completeness, and I agree with the above. Redell JUDITHANN Hans, M.D., Ph.D. 08/09/24 8:37 PM   Abbreviations: M myopia (nearsighted); A astigmatism; H hyperopia (farsighted); P presbyopia; Mrx spectacle prescription;  CTL contact lenses; OD right eye; OS left eye; OU both eyes  XT exotropia; ET esotropia; PEK punctate epithelial keratitis; PEE punctate epithelial erosions; DES dry eye syndrome; MGD meibomian gland dysfunction; ATs artificial tears; PFAT's preservative free artificial tears; NSC nuclear sclerotic cataract; PSC posterior subcapsular cataract; ERM epi-retinal membrane; PVD posterior vitreous detachment; RD retinal detachment; DM diabetes mellitus; DR diabetic  retinopathy; NPDR non-proliferative diabetic retinopathy; PDR proliferative diabetic retinopathy; CSME clinically significant macular edema; DME  diabetic macular edema; dbh dot blot hemorrhages; CWS cotton wool spot; POAG primary open angle glaucoma; C/D cup-to-disc ratio; HVF humphrey visual field; GVF goldmann visual field; OCT optical coherence tomography; IOP intraocular pressure; BRVO Branch retinal vein occlusion; CRVO central retinal vein occlusion; CRAO central retinal artery occlusion; BRAO branch retinal artery occlusion; RT retinal tear; SB scleral buckle; PPV pars plana vitrectomy; VH Vitreous hemorrhage; PRP panretinal laser photocoagulation; IVK intravitreal kenalog; VMT vitreomacular traction; MH Macular hole;  NVD neovascularization of the disc; NVE neovascularization elsewhere; AREDS age related eye disease study; ARMD age related macular degeneration; POAG primary open angle glaucoma; EBMD epithelial/anterior basement membrane dystrophy; ACIOL anterior chamber intraocular lens; IOL intraocular lens; PCIOL posterior chamber intraocular lens; Phaco/IOL phacoemulsification with intraocular lens placement; PRK photorefractive keratectomy; LASIK laser assisted in situ keratomileusis; HTN hypertension; DM diabetes mellitus; COPD chronic obstructive pulmonary disease

## 2024-08-05 ENCOUNTER — Ambulatory Visit (INDEPENDENT_AMBULATORY_CARE_PROVIDER_SITE_OTHER): Admitting: Ophthalmology

## 2024-08-05 ENCOUNTER — Encounter (INDEPENDENT_AMBULATORY_CARE_PROVIDER_SITE_OTHER): Payer: Self-pay | Admitting: Ophthalmology

## 2024-08-05 DIAGNOSIS — H25813 Combined forms of age-related cataract, bilateral: Secondary | ICD-10-CM | POA: Diagnosis not present

## 2024-08-05 DIAGNOSIS — H353122 Nonexudative age-related macular degeneration, left eye, intermediate dry stage: Secondary | ICD-10-CM

## 2024-08-05 DIAGNOSIS — H353111 Nonexudative age-related macular degeneration, right eye, early dry stage: Secondary | ICD-10-CM

## 2024-08-09 ENCOUNTER — Encounter (INDEPENDENT_AMBULATORY_CARE_PROVIDER_SITE_OTHER): Payer: Self-pay | Admitting: Ophthalmology

## 2024-08-10 ENCOUNTER — Other Ambulatory Visit: Payer: Self-pay

## 2024-08-10 ENCOUNTER — Ambulatory Visit (INDEPENDENT_AMBULATORY_CARE_PROVIDER_SITE_OTHER): Admitting: Family Medicine

## 2024-08-10 ENCOUNTER — Ambulatory Visit (INDEPENDENT_AMBULATORY_CARE_PROVIDER_SITE_OTHER)

## 2024-08-10 VITALS — BP 162/88 | HR 79 | Ht 67.0 in

## 2024-08-10 DIAGNOSIS — M25562 Pain in left knee: Secondary | ICD-10-CM

## 2024-08-10 DIAGNOSIS — G8929 Other chronic pain: Secondary | ICD-10-CM

## 2024-08-10 DIAGNOSIS — M1712 Unilateral primary osteoarthritis, left knee: Secondary | ICD-10-CM | POA: Insufficient documentation

## 2024-08-10 DIAGNOSIS — M25572 Pain in left ankle and joints of left foot: Secondary | ICD-10-CM

## 2024-08-10 NOTE — Patient Instructions (Addendum)
 Thank you for coming in today.   Please get an Xray today before you leave   Please use Voltaren gel (Generic Diclofenac Gel) up to 4x daily for pain as needed.  This is available over-the-counter as both the name brand Voltaren gel and the generic diclofenac gel.   Check back in 8 weeks  I've referred you to Physical Therapy here, at this office.  You will hear from our office soon about scheduling, once we check with your insurance company.   I recommend you obtained a compression sleeve to help with your joint problems. There are many options on the market however I recommend obtaining a Full Ankle Body Helix compression sleeve.  You can find information (including how to appropriate measure yourself for sizing) can be found at www.[email or web address].ch.  Many of these products are health savings account (HSA) eligible.  You can use the compression sleeve at any time throughout the day but is most important to use while being active as well as for 2 hours post-activity.   It is appropriate to ice following activity with the compression sleeve in place.

## 2024-08-10 NOTE — Progress Notes (Signed)
 Cindy Ileana Collet, PhD, LAT, ATC acting as a scribe for Artist Lloyd, MD.  [name] is a 68 y.o. female who presents to [facility] today for L knee pain ongoing since Aug 1st. She recalls a twisting mechanism. Pt locates pain to the anterior-lateral aspect of the L knee.   L Knee swelling: slight- has improved Mechanical symptoms: yes Aggravates: walking, stairs Treatments tried: IBU  Pt also c/o L ankle pain ongoing since the spring. She notes pain and swelling progressively worsens throughout the day. Pt locates pain to the lateral aspect of her L ankle.   Swelling: yes Treatments tried: compression socks  Pertinent review of systems: No fevers  Relevant historical information: History of breast cancer.  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   Exam:  BP (!) 162/88   Pulse 79   Ht 5' 7 (1.702 m)   SpO2 98%   BMI 28.63 kg/m  General: Well Developed, well nourished, and in no acute distress.   MSK: Left knee mild effusion normal-appearing otherwise normal motion with crepitation.  Tender palpation medial and lateral joint line. Intact strength. Stable ligamentous exam. Positive McMurray's test.  Left ankle swelling along lateral aspect of ankle.  Tender palpation along the course of the peroneal tendons. Stable ligamentous exam. Intact strength some pain is present with resisted ankle eversion.    Lab and Radiology Results  Diagnostic Limited MSK Ultrasound of: Left ankle and knee Left knee: Mild joint effusion.  Medial degeneration no Baker's cyst. Left ankle: Peroneal tenosynovitis is present with possible partial tear of the peroneal brevis tendon around the lateral ankle.  This would be a longitudinal partial tear without retraction. 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  Peroneal tenosynovitis with possible partial longitudinal  tear.   X-ray images left knee and ankle obtained today personally and independently interpreted.  Left knee: Mild medial DJD mild to moderate patellofemoral DJD.  No acute fractures are visible.  Left ankle: Mild medial DJD.  No acute fractures are visible.  Await formal radiology review   Assessment and Plan: 68 y.o. female with left knee pain after a twisting injury occurring over a month ago.  Pain due to exacerbation of DJD and possibly a degenerative meniscus tear.  Plan for Voltaren gel and physical therapy.  Consider steroid injection especially if not improving.  Left lateral ankle pain predominantly due to peroneal tenosynovitis with possible longitudinal tear.  Plan for compression sleeve and Voltaren gel.  Plan for physical therapy.  If not improved consider injection.  Recheck in 8 weeks return sooner if needed.   PDMP not reviewed this encounter. Orders Placed This Encounter  Procedures   US  LIMITED JOINT SPACE STRUCTURES LOW LEFT(NO LINKED CHARGES)    Reason for Exam (SYMPTOM  OR DIAGNOSIS REQUIRED):   left knee pain    Preferred imaging location?:   [facility] Knee AP/LAT W/[place] Left    Standing Status:   Future    Number of Occurrences:   1    Expiration Date:   09/09/2024    Reason for Exam (SYMPTOM  OR DIAGNOSIS REQUIRED):   left knee pain    Preferred imaging location?:   [facility]   DG Ankle Complete Left    Standing Status:   Future    Number of Occurrences:  1    Expiration Date:   08/10/2025    Reason for Exam (SYMPTOM  OR DIAGNOSIS REQUIRED):   left ankle pain    Preferred imaging location?:   [facility]   Ambulatory referral to Physical Therapy    Referral Priority:   Routine    Referral Type:   Physical Medicine    Referral Reason:   Specialty Services Required    Requested Specialty:   Physical Therapy    Number of Visits Requested:   1   No orders of the defined types were placed in this  encounter.    Discussed warning signs or symptoms. Please see discharge instructions. Patient expresses understanding.   The above documentation has been reviewed and is accurate and complete Artist Lloyd, M.D.

## 2024-08-18 ENCOUNTER — Ambulatory Visit: Payer: Self-pay | Admitting: Family Medicine

## 2024-08-18 NOTE — Progress Notes (Signed)
 Left ankle x-ray shows a old healed fracture.  No new acute fractures are present.

## 2024-08-18 NOTE — Progress Notes (Signed)
Left knee x-ray shows medium arthritis.

## 2024-08-24 NOTE — Therapy (Signed)
 OUTPATIENT PHYSICAL THERAPY EVALUATION   Patient Name: Cindy Roman MRN: [id number] DOB:[birth year], 68 y.o., female Today's Date: 08/25/2024   END OF SESSION:  PT End of Session - 08/25/24 1056     Visit Number 1    Number of Visits 17    Date for Recertification  10/20/24    Authorization Type MCR    Progress Note Due on Visit 10    PT Start Time 1100    PT Stop Time 1154    PT Time Calculation (min) 54 min    Activity Tolerance Patient tolerated treatment well    Behavior During Therapy [facility] for tasks assessed/performed          Past Medical History:  Diagnosis Date   Breast cancer (HCC) 2021   COVID 12/05/2020   Past Surgical History:  Procedure Laterality Date   ANKLE SURGERY     BREAST BIOPSY Left 2003   BREAST LUMPECTOMY Right 12/22/2020   DCIS   BREAST LUMPECTOMY WITH RADIOACTIVE SEED LOCALIZATION Right 12/22/2020   Procedure: RIGHT BREAST LUMPECTOMY WITH RADIOACTIVE SEED LOCALIZATION;  Surgeon: Ebbie Cough, MD;  Location: [facility];  Service: General;  Laterality: Right;   CHOLECYSTECTOMY     WRIST SURGERY     Patient Active Problem List   Diagnosis Date Noted   Primary osteoarthritis of left knee 08/10/2024   Genital herpes simplex 06/08/2021   Ductal carcinoma in situ (DCIS) of right breast 10/12/2020   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   Left knee pain 04/24/2018   Toe ulcer, left, limited to breakdown of skin (HCC) 04/24/2018   Unilateral primary osteoarthritis, right hip 05/20/2017   Unilateral primary osteoarthritis, right knee 05/20/2017    PCP: Claudene Pellet, MD  REFERRING PROVIDER: Joane Artist RAMAN, MD  REFERRING DIAG: Chronic pain of left knee; Chronic pain of left ankle  THERAPY DIAG:  Chronic pain of left knee  Pain in left ankle and joints of left foot  Muscle weakness (generalized)  Rationale for Evaluation and Treatment: Rehabilitation  ONSET DATE: Chronic   SUBJECTIVE:  SUBJECTIVE  STATEMENT: Patient reports left knee pain after she turned while her foot was planted creating a twisting mechanism, and her ankle hurts and stiff in the morning and then will swell with activity. She feels difficulty straightening her knee with pain more in front of the knee. She also reports if she is sitting for while and then she gets up she will have generalized pain going down her legs. The left knee pain occurred back in August, and the left ankle pain has been ongoing since March, and then generalized LE pain for a while. She states she hasn't been able to exercise or participate in activity because of the pain and she doesn't feel strong enough. She does note clicking, popping, catching of both her knees.   PERTINENT HISTORY: See PMH above  PAIN:  Are you having pain? Yes:  NPRS scale: 2-3/10 currently, 5/10 at worst Pain location: Left knee and ankle Pain description: Throbbing Aggravating factors: Long walks, exercise, stairs, squatting Relieving factors: OTC meds, rest  PRECAUTIONS: None  RED FLAGS: None   WEIGHT BEARING RESTRICTIONS: No  FALLS:  Has patient fallen in last 6 months? No  OCCUPATION: Real estate  PLOF: Independent  PATIENT GOALS: Pain relief and improve squatting   OBJECTIVE:  Note: Objective measures were completed at Evaluation unless otherwise noted. PATIENT SURVEYS:  PSFS: 5 Walking: 7 Yoga: 6 Pickle ball: 2  COGNITION: Overall cognitive  status: Within functional limits for tasks assessed     SENSATION: WFL  EDEMA:  Mild edema observed lateral left ankle  MUSCLE LENGTH: Left calf tightness  POSTURE:   Grossly WFL  PALPATION: Tender to palpation   LOWER EXTREMITY ROM:  Active ROM Right eval Left eval  Hip flexion    Hip extension    Hip abduction    Hip adduction    Hip internal rotation    Hip external rotation    Knee flexion 128 120  Knee extension 0 Lacking 8  Ankle dorsiflexion 6 Lacking 4  Ankle plantarflexion  60 60  Ankle inversion 40 28  Ankle eversion 23 20   (Blank rows = not tested)  LOWER EXTREMITY MMT:  MMT Right eval Left eval  Hip flexion    Hip extension 4 4-  Hip abduction 4 4  Hip adduction    Hip internal rotation    Hip external rotation    Knee flexion 5 5  Knee extension 4+ 4  Ankle dorsiflexion 5 4+  Ankle plantarflexion 5 4  Ankle inversion 5 4  Ankle eversion 5 4   (Blank rows = not tested)  FUNCTIONAL TESTS:  30 seconds chair stand test: 13 reps  SLS: 30+ seconds bilaterally but she does exhibit greater difficulty and increased sway on left  GAIT: Assistive device utilized: None Level of assistance: Complete Independence Comments: Antalgic on left                                                                                                                               TREATMENT [facility] Adult PT Treatment:                                                DATE: 08/25/2024 Calf stretch at counter Longsitting ankle PF, eversion, inversion with red Side clamshell with red Standing TKE with green  Discussed nature of left knee and ankle pain and plan for PT to be able to gradually progress her back to activity. Use of  wrap for ankle for ankle swelling management only.  PATIENT EDUCATION:  Education details: Exam findings, POC, HEP Person educated: Patient Education method: Explanation, Demonstration, Tactile cues, Verbal cues, and Handouts Education comprehension: verbalized understanding, returned demonstration, verbal cues required, tactile cues required, and needs further education  HOME EXERCISE PROGRAM: Access Code: [email or web address]    ASSESSMENT: CLINICAL IMPRESSION: Patient is a 68 y.o. female who was seen today for physical therapy evaluation and treatment for chronic left knee and ankle pain. She demonstrates limitations in her left knee and ankle mobility, as well as limitations with her left hip and leg strength that is likely contributing to her  persistent pain and impacting her functional ability.   OBJECTIVE IMPAIRMENTS: Abnormal gait, decreased activity tolerance, decreased balance, decreased ROM,  decreased strength, impaired flexibility, and pain.   ACTIVITY LIMITATIONS: sitting, standing, squatting, sleeping, stairs, and locomotion level  PARTICIPATION LIMITATIONS: meal prep, cleaning, shopping, community activity, and yard work  PERSONAL FACTORS: Fitness, Past/current experiences, and Time since onset of injury/illness/exacerbation are also affecting patient's functional outcome.   REHAB POTENTIAL: Good  CLINICAL DECISION MAKING: Stable/uncomplicated  EVALUATION COMPLEXITY: Low   GOALS: Goals reviewed with patient? Yes  SHORT TERM GOALS: Target date: 09/22/2024  Patient will be I with initial HEP in order to progress with therapy. Baseline: HEP provided at eval Goal status: INITIAL  2.  Patient will report left knee and ankle pain </= 2/10 with activity in order to reduce functional limitations Baseline: 5/10 Goal status: INITIAL  LONG TERM GOALS: Target date: 10/20/2024  Patient will be I with final HEP to maintain progress from PT. Baseline: HEP provided at eval Goal status: INITIAL  2.  Patient will report PSFS >/= 7 in order to indicate improvement in their functional ability. Baseline: 5 Goal status: INITIAL  3.  Patient will demonstrate left ankle DF >/= 5 deg in order to improve gait Baseline: see limitations above Goal status: INITIAL  4.  Patient will demonstrate left knee extension to 0 deg in order to normalize gait and mobility Baseline: see limitations above Goal status: INITIAL  5. Patient will demonstrate knee and ankle strength 5/5 MMT in order to improve her activity tolerance  Baseline: see limitations above  Goal status: INITIAL   PLAN: PT FREQUENCY: 1-2x/week  PT DURATION: 8 weeks  PLANNED INTERVENTIONS: 97164- PT Re-evaluation, 97750- Physical Performance Testing,  97110-Therapeutic exercises, 97530- Therapeutic activity, 97112- Neuromuscular re-education, 97535- Self Care, 02859- Manual therapy, [phone]- Gait training, [phone]- Ionotophoresis 4mg /ml Dexamethasone , 79439 (1-2 muscles), 20561 (3+ muscles)- Dry Needling, Patient/Family education, Balance training, Taping, Joint mobilization, Joint manipulation, Spinal manipulation, Spinal mobilization, Cryotherapy, and Moist heat  PLAN FOR NEXT SESSION: Review HEP and progress PRN, manual/mobs for left knee and ankle to improve motion, calf stretching, progress knee and ankle strengthening, hip strengthening, gait training   Elaine Daring, PT, DPT, LAT, ATC 08/25/24  4:18 PM Phone: [phone] Fax: [phone]

## 2024-08-25 ENCOUNTER — Encounter: Payer: Self-pay | Admitting: Physical Therapy

## 2024-08-25 ENCOUNTER — Ambulatory Visit (INDEPENDENT_AMBULATORY_CARE_PROVIDER_SITE_OTHER): Admitting: Physical Therapy

## 2024-08-25 ENCOUNTER — Other Ambulatory Visit: Payer: Self-pay

## 2024-08-25 DIAGNOSIS — M6281 Muscle weakness (generalized): Secondary | ICD-10-CM

## 2024-08-25 DIAGNOSIS — M25562 Pain in left knee: Secondary | ICD-10-CM | POA: Diagnosis not present

## 2024-08-25 DIAGNOSIS — M25572 Pain in left ankle and joints of left foot: Secondary | ICD-10-CM | POA: Diagnosis not present

## 2024-08-25 DIAGNOSIS — G8929 Other chronic pain: Secondary | ICD-10-CM

## 2024-08-25 NOTE — Patient Instructions (Signed)
 Access Code: [email or web address] URL: [email or web address] Date: 08/25/2024 Prepared by: Elaine Daring  Exercises - Standing Gastroc Stretch at Counter  - 1 x daily - 3 reps - 30 seconds hold - Long Sitting Ankle Plantar Flexion with Resistance  - 1 x daily - 2-3 sets - 10 reps - Long Sitting Ankle Eversion with Resistance  - 1 x daily - 2-3 sets - 10 reps - Long Sitting Ankle Inversion with Resistance  - 1 x daily - 2-3 sets - 10 reps - Clam with Resistance  - 1 x daily - 3 sets - 10 reps - Standing Terminal Knee Extension with Resistance  - 1 x daily - 3 sets - 10 reps

## 2024-09-02 ENCOUNTER — Encounter: Payer: Self-pay | Admitting: Physical Therapy

## 2024-09-02 ENCOUNTER — Ambulatory Visit (INDEPENDENT_AMBULATORY_CARE_PROVIDER_SITE_OTHER): Admitting: Physical Therapy

## 2024-09-02 ENCOUNTER — Other Ambulatory Visit: Payer: Self-pay

## 2024-09-02 DIAGNOSIS — G8929 Other chronic pain: Secondary | ICD-10-CM | POA: Diagnosis not present

## 2024-09-02 DIAGNOSIS — M6281 Muscle weakness (generalized): Secondary | ICD-10-CM | POA: Diagnosis not present

## 2024-09-02 DIAGNOSIS — M25572 Pain in left ankle and joints of left foot: Secondary | ICD-10-CM

## 2024-09-02 DIAGNOSIS — M25562 Pain in left knee: Secondary | ICD-10-CM | POA: Diagnosis not present

## 2024-09-02 NOTE — Patient Instructions (Signed)
 Access Code: [email or web address] URL: [email or web address] Date: 09/02/2024 Prepared by: Elaine Daring  Exercises - Standing Gastroc Stretch at Counter  - 1 x daily - 3 reps - 30 seconds hold - Seated Hamstring Stretch with Chair  - 1 x daily - 3 reps - 30seconds hold - Long Sitting Ankle Plantar Flexion with Resistance  - 1 x daily - 2-3 sets - 10 reps - Long Sitting Ankle Eversion with Resistance  - 1 x daily - 2-3 sets - 10 reps - Long Sitting Ankle Inversion with Resistance  - 1 x daily - 2-3 sets - 10 reps - Clam with Resistance  - 1 x daily - 3 sets - 10 reps - Standing Terminal Knee Extension with Resistance  - 1 x daily - 3 sets - 10 reps

## 2024-09-02 NOTE — Therapy (Signed)
 OUTPATIENT PHYSICAL THERAPY TREATMENT   Patient Name: Cindy Roman MRN: [id number] DOB:05-Nov-1956, 68 y.o., female Today's Date: 09/02/2024   END OF SESSION:  PT End of Session - 09/02/24 1021     Visit Number 2    Number of Visits 17    Date for Recertification  10/20/24    Authorization Type MCR    Progress Note Due on Visit 10    PT Start Time 1021    PT Stop Time 1100    PT Time Calculation (min) 39 min    Activity Tolerance Patient tolerated treatment well    Behavior During Therapy [facility] for tasks assessed/performed           Past Medical History:  Diagnosis Date   Breast cancer (HCC) 2021   COVID 12/05/2020   Past Surgical History:  Procedure Laterality Date   ANKLE SURGERY     BREAST BIOPSY Left 2003   BREAST LUMPECTOMY Right 12/22/2020   DCIS   BREAST LUMPECTOMY WITH RADIOACTIVE SEED LOCALIZATION Right 12/22/2020   Procedure: RIGHT BREAST LUMPECTOMY WITH RADIOACTIVE SEED LOCALIZATION;  Surgeon: Ebbie Cough, MD;  Location: [facility];  Service: General;  Laterality: Right;   CHOLECYSTECTOMY     WRIST SURGERY     Patient Active Problem List   Diagnosis Date Noted   Primary osteoarthritis of left knee 08/10/2024   Genital herpes simplex 06/08/2021   Ductal carcinoma in situ (DCIS) of right breast 10/12/2020   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   Left knee pain 04/24/2018   Toe ulcer, left, limited to breakdown of skin (HCC) 04/24/2018   Unilateral primary osteoarthritis, right hip 05/20/2017   Unilateral primary osteoarthritis, right knee 05/20/2017    PCP: Claudene Pellet, MD  REFERRING PROVIDER: Joane Artist RAMAN, MD  REFERRING DIAG: Chronic pain of left knee; Chronic pain of left ankle  THERAPY DIAG:  Chronic pain of left knee  Pain in left ankle and joints of left foot  Muscle weakness (generalized)  Rationale for Evaluation and Treatment: Rehabilitation  ONSET DATE: Chronic   SUBJECTIVE:  SUBJECTIVE  STATEMENT: Patient reports she is doing well. Her knee is still hurting and her ankle still hurts when she wakes up and standing, swelling not as much at the end of the day.   Eval: Patient reports left knee pain after she turned while her foot was planted creating a twisting mechanism, and her ankle hurts and stiff in the morning and then will swell with activity. She feels difficulty straightening her knee with pain more in front of the knee. She also reports if she is sitting for while and then she gets up she will have generalized pain going down her legs. The left knee pain occurred back in August, and the left ankle pain has been ongoing since March, and then generalized LE pain for a while. She states she hasn't been able to exercise or participate in activity because of the pain and she doesn't feel strong enough. She does note clicking, popping, catching of both her knees.   PERTINENT HISTORY: See PMH above  PAIN:  Are you having pain? Yes:  NPRS scale: 2-3/10 currently, 5/10 at worst Pain location: Left knee and ankle Pain description: Throbbing Aggravating factors: Long walks, exercise, stairs, squatting Relieving factors: OTC meds, rest  PRECAUTIONS: None  PATIENT GOALS: Pain relief and improve squatting   OBJECTIVE:  Note: Objective measures were completed at Evaluation unless otherwise noted. PATIENT SURVEYS:  PSFS: 5 Walking: 7 Yoga:  6 Pickle ball: 2  EDEMA:  Mild edema observed lateral left ankle  MUSCLE LENGTH: Left calf tightness  POSTURE:   Grossly WFL  PALPATION: Tender to palpation   LOWER EXTREMITY ROM:  Active ROM Right eval Left eval  Hip flexion    Hip extension    Hip abduction    Hip adduction    Hip internal rotation    Hip external rotation    Knee flexion 128 120  Knee extension 0 Lacking 8  Ankle dorsiflexion 6 Lacking 4  Ankle plantarflexion 60 60  Ankle inversion 40 28  Ankle eversion 23 20   (Blank rows = not tested)  LOWER  EXTREMITY MMT:  MMT Right eval Left eval  Hip flexion    Hip extension 4 4-  Hip abduction 4 4  Hip adduction    Hip internal rotation    Hip external rotation    Knee flexion 5 5  Knee extension 4+ 4  Ankle dorsiflexion 5 4+  Ankle plantarflexion 5 4  Ankle inversion 5 4  Ankle eversion 5 4   (Blank rows = not tested)  FUNCTIONAL TESTS:  30 seconds chair stand test: 13 reps  SLS: 30+ seconds bilaterally but she does exhibit greater difficulty and increased sway on left  GAIT: Assistive device utilized: None Level of assistance: Complete Independence Comments: Antalgic on left                                                                                                                               TREATMENT [facility] Adult PT Treatment:                                                DATE: 09/02/2024 Recumbent bike L4 x 5 min to improve her endurance and workload capacity Slant board calf stretch 3 x 30 sec Seated and hooklying knee joint mobs to improve knee extension Seated hamstring stretch with foot in chair 3 x 20 sec Longsitting patellar mobs all directions Longsitting talocrural, subtalar, and distal tib-fib mobs to improve ankle DF Passive ankle DF stretching  PATIENT EDUCATION:  Education details: HEP update Person educated: Patient Education method: Explanation, Demonstration, Tactile cues, Verbal cues, and Handouts Education comprehension: verbalized understanding, returned demonstration, verbal cues required, tactile cues required, and needs further education  HOME EXERCISE PROGRAM: Access Code: [email or web address]    ASSESSMENT: CLINICAL IMPRESSION: Patient tolerated therapy well with no adverse effects. Therapy focused primarily on improving her knee and ankle mobility with good tolerance. Incorporated some hamstring stretch and joint mobs to improve knee extension and ankle joint mobs to improve her dorsiflexion. She reports primarily lateral joint line pain of the  left knee that seems to be restricting her knee extension and mainly swelling toward end of the day for the left ankle. Updated  her HEP to include hamstring stretching at home. Patient would benefit from continued skilled PT to progress mobility and strength in order to reduce pain and maximize functional ability.   Eval: Patient is a 68 y.o. female who was seen today for physical therapy evaluation and treatment for chronic left knee and ankle pain. She demonstrates limitations in her left knee and ankle mobility, as well as limitations with her left hip and leg strength that is likely contributing to her persistent pain and impacting her functional ability.   OBJECTIVE IMPAIRMENTS: Abnormal gait, decreased activity tolerance, decreased balance, decreased ROM, decreased strength, impaired flexibility, and pain.   ACTIVITY LIMITATIONS: sitting, standing, squatting, sleeping, stairs, and locomotion level  PARTICIPATION LIMITATIONS: meal prep, cleaning, shopping, community activity, and yard work  PERSONAL FACTORS: Fitness, Past/current experiences, and Time since onset of injury/illness/exacerbation are also affecting patient's functional outcome.    GOALS: Goals reviewed with patient? Yes  SHORT TERM GOALS: Target date: 09/22/2024  Patient will be I with initial HEP in order to progress with therapy. Baseline: HEP provided at eval Goal status: INITIAL  2.  Patient will report left knee and ankle pain </= 2/10 with activity in order to reduce functional limitations Baseline: 5/10 Goal status: INITIAL  LONG TERM GOALS: Target date: 10/20/2024  Patient will be I with final HEP to maintain progress from PT. Baseline: HEP provided at eval Goal status: INITIAL  2.  Patient will report PSFS >/= 7 in order to indicate improvement in their functional ability. Baseline: 5 Goal status: INITIAL  3.  Patient will demonstrate left ankle DF >/= 5 deg in order to improve gait Baseline: see  limitations above Goal status: INITIAL  4.  Patient will demonstrate left knee extension to 0 deg in order to normalize gait and mobility Baseline: see limitations above Goal status: INITIAL  5. Patient will demonstrate knee and ankle strength 5/5 MMT in order to improve her activity tolerance  Baseline: see limitations above  Goal status: INITIAL   PLAN: PT FREQUENCY: 1-2x/week  PT DURATION: 8 weeks  PLANNED INTERVENTIONS: 97164- PT Re-evaluation, 97750- Physical Performance Testing, 97110-Therapeutic exercises, 97530- Therapeutic activity, 97112- Neuromuscular re-education, 97535- Self Care, 02859- Manual therapy, [phone]- Gait training, [phone]- Ionotophoresis 4mg /ml Dexamethasone , 79439 (1-2 muscles), 20561 (3+ muscles)- Dry Needling, Patient/Family education, Balance training, Taping, Joint mobilization, Joint manipulation, Spinal manipulation, Spinal mobilization, Cryotherapy, and Moist heat  PLAN FOR NEXT SESSION: Review HEP and progress PRN, manual/mobs for left knee and ankle to improve motion, calf stretching, progress knee and ankle strengthening, hip strengthening, gait training   Elaine Daring, PT, DPT, LAT, ATC 09/02/24  10:58 AM Phone: [phone] Fax: [phone]

## 2024-09-04 ENCOUNTER — Other Ambulatory Visit: Payer: Self-pay

## 2024-09-04 ENCOUNTER — Encounter: Payer: Self-pay | Admitting: Physical Therapy

## 2024-09-04 ENCOUNTER — Ambulatory Visit (INDEPENDENT_AMBULATORY_CARE_PROVIDER_SITE_OTHER): Admitting: Physical Therapy

## 2024-09-04 DIAGNOSIS — M25562 Pain in left knee: Secondary | ICD-10-CM | POA: Diagnosis not present

## 2024-09-04 DIAGNOSIS — M25572 Pain in left ankle and joints of left foot: Secondary | ICD-10-CM

## 2024-09-04 DIAGNOSIS — G8929 Other chronic pain: Secondary | ICD-10-CM

## 2024-09-04 DIAGNOSIS — M6281 Muscle weakness (generalized): Secondary | ICD-10-CM

## 2024-09-04 NOTE — Therapy (Signed)
 OUTPATIENT PHYSICAL THERAPY TREATMENT   Patient Name: Cindy Roman MRN: [id number] DOB:[birth year], 68 y.o., female Today's Date: 09/04/2024   END OF SESSION:  PT End of Session - 09/04/24 1030     Visit Number 3    Number of Visits 17    Date for Recertification  10/20/24    Authorization Type MCR    Progress Note Due on Visit 10    PT Start Time 1025    PT Stop Time 1105    PT Time Calculation (min) 40 min    Activity Tolerance Patient tolerated treatment well    Behavior During Therapy [facility] for tasks assessed/performed            Past Medical History:  Diagnosis Date   Breast cancer (HCC) 2021   COVID 12/05/2020   Past Surgical History:  Procedure Laterality Date   ANKLE SURGERY     BREAST BIOPSY Left 2003   BREAST LUMPECTOMY Right 12/22/2020   DCIS   BREAST LUMPECTOMY WITH RADIOACTIVE SEED LOCALIZATION Right 12/22/2020   Procedure: RIGHT BREAST LUMPECTOMY WITH RADIOACTIVE SEED LOCALIZATION;  Surgeon: Ebbie Cough, MD;  Location: [facility];  Service: General;  Laterality: Right;   CHOLECYSTECTOMY     WRIST SURGERY     Patient Active Problem List   Diagnosis Date Noted   Primary osteoarthritis of left knee 08/10/2024   Genital herpes simplex 06/08/2021   Ductal carcinoma in situ (DCIS) of right breast 10/12/2020   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   Left knee pain 04/24/2018   Toe ulcer, left, limited to breakdown of skin (HCC) 04/24/2018   Unilateral primary osteoarthritis, right hip 05/20/2017   Unilateral primary osteoarthritis, right knee 05/20/2017    PCP: Claudene Pellet, MD  REFERRING PROVIDER: Joane Artist RAMAN, MD  REFERRING DIAG: Chronic pain of left knee; Chronic pain of left ankle  THERAPY DIAG:  Chronic pain of left knee  Pain in left ankle and joints of left foot  Muscle weakness (generalized)  Rationale for Evaluation and Treatment: Rehabilitation  ONSET DATE: Chronic   SUBJECTIVE:   SUBJECTIVE STATEMENT: Patient reports she went for a long walk yesterday and she felt good. She reports she still feels the pain on the front/outside of the knee when trying to straighten the knee.   Eval: Patient reports left knee pain after she turned while her foot was planted creating a twisting mechanism, and her ankle hurts and stiff in the morning and then will swell with activity. She feels difficulty straightening her knee with pain more in front of the knee. She also reports if she is sitting for while and then she gets up she will have generalized pain going down her legs. The left knee pain occurred back in August, and the left ankle pain has been ongoing since March, and then generalized LE pain for a while. She states she hasn't been able to exercise or participate in activity because of the pain and she doesn't feel strong enough. She does note clicking, popping, catching of both her knees.   PERTINENT HISTORY: See PMH above  PAIN:  Are you having pain? Yes:  NPRS scale: 2-3/10 currently, 5/10 at worst Pain location: Left knee and ankle Pain description: Throbbing Aggravating factors: Long walks, exercise, stairs, squatting Relieving factors: OTC meds, rest  PRECAUTIONS: None  PATIENT GOALS: Pain relief and improve squatting   OBJECTIVE:  Note: Objective measures were completed at Evaluation unless otherwise noted. PATIENT SURVEYS:  PSFS: 5 Walking: 7  Yoga: 6 Pickle ball: 2  EDEMA:  Mild edema observed lateral left ankle  MUSCLE LENGTH: Left calf tightness  POSTURE:   Grossly WFL  PALPATION: Tender to palpation   LOWER EXTREMITY ROM:  Active ROM Right eval Left eval  Hip flexion    Hip extension    Hip abduction    Hip adduction    Hip internal rotation    Hip external rotation    Knee flexion 128 120  Knee extension 0 Lacking 8  Ankle dorsiflexion 6 Lacking 4  Ankle plantarflexion 60 60  Ankle inversion 40 28  Ankle eversion 23 20   (Blank  rows = not tested)  LOWER EXTREMITY MMT:  MMT Right eval Left eval  Hip flexion    Hip extension 4 4-  Hip abduction 4 4  Hip adduction    Hip internal rotation    Hip external rotation    Knee flexion 5 5  Knee extension 4+ 4  Ankle dorsiflexion 5 4+  Ankle plantarflexion 5 4  Ankle inversion 5 4  Ankle eversion 5 4   (Blank rows = not tested)  FUNCTIONAL TESTS:  30 seconds chair stand test: 13 reps  SLS: 30+ seconds bilaterally but she does exhibit greater difficulty and increased sway on left  GAIT: Assistive device utilized: None Level of assistance: Complete Independence Comments: Antalgic on left                                                                                                                               TREATMENT [facility] Adult PT Treatment:                                                DATE: 09/04/2024 Recumbent bike L4 x 5 min to improve her endurance and workload capacity Seated and hooklying knee joint PA and rotational mobs to improve knee extension Seated squeeze technique over lateral joint line with active assisted knee extension Supine patellar mobs all directions Supine lateral femoral glide with belt combined with passive knee extension Passive ITB/hamstring stretch 3 x 30 sec Quad set with towel roll under knee 10 x 5 sec SLR with towel roll under knee 2 x 10  PATIENT EDUCATION:  Education details: HEP update Person educated: Patient Education method: Programmer, multimedia, Demonstration, Actor cues, Verbal cues, and Handouts Education comprehension: verbalized understanding, returned demonstration, verbal cues required, tactile cues required, and needs further education  HOME EXERCISE PROGRAM: Access Code: [email or web address]    ASSESSMENT: CLINICAL IMPRESSION: Patient tolerated therapy well with no adverse effects. Therapy focused primarily on manual therapy for the left knee to improve knee mobility into extension and reduce the anterior lateral  joint like pain she is experiencing. She continues to have pain at end range active and passive extension with pain primarily more lateral  on the knee. Updated her HEP this visit to incorporate SLR exercise with towel roll supporting behind knee. Patient would benefit from continued skilled PT to progress mobility and strength in order to reduce pain and maximize functional ability.   Eval: Patient is a 68 y.o. female who was seen today for physical therapy evaluation and treatment for chronic left knee and ankle pain. She demonstrates limitations in her left knee and ankle mobility, as well as limitations with her left hip and leg strength that is likely contributing to her persistent pain and impacting her functional ability.   OBJECTIVE IMPAIRMENTS: Abnormal gait, decreased activity tolerance, decreased balance, decreased ROM, decreased strength, impaired flexibility, and pain.   ACTIVITY LIMITATIONS: sitting, standing, squatting, sleeping, stairs, and locomotion level  PARTICIPATION LIMITATIONS: meal prep, cleaning, shopping, community activity, and yard work  PERSONAL FACTORS: Fitness, Past/current experiences, and Time since onset of injury/illness/exacerbation are also affecting patient's functional outcome.    GOALS: Goals reviewed with patient? Yes  SHORT TERM GOALS: Target date: 09/22/2024  Patient will be I with initial HEP in order to progress with therapy. Baseline: HEP provided at eval Goal status: INITIAL  2.  Patient will report left knee and ankle pain </= 2/10 with activity in order to reduce functional limitations Baseline: 5/10 Goal status: INITIAL  LONG TERM GOALS: Target date: 10/20/2024  Patient will be I with final HEP to maintain progress from PT. Baseline: HEP provided at eval Goal status: INITIAL  2.  Patient will report PSFS >/= 7 in order to indicate improvement in their functional ability. Baseline: 5 Goal status: INITIAL  3.  Patient will  demonstrate left ankle DF >/= 5 deg in order to improve gait Baseline: see limitations above Goal status: INITIAL  4.  Patient will demonstrate left knee extension to 0 deg in order to normalize gait and mobility Baseline: see limitations above Goal status: INITIAL  5. Patient will demonstrate knee and ankle strength 5/5 MMT in order to improve her activity tolerance  Baseline: see limitations above  Goal status: INITIAL   PLAN: PT FREQUENCY: 1-2x/week  PT DURATION: 8 weeks  PLANNED INTERVENTIONS: 97164- PT Re-evaluation, 97750- Physical Performance Testing, 97110-Therapeutic exercises, 97530- Therapeutic activity, 97112- Neuromuscular re-education, 97535- Self Care, 02859- Manual therapy, [phone]- Gait training, [phone]- Ionotophoresis 4mg /ml Dexamethasone , 79439 (1-2 muscles), 20561 (3+ muscles)- Dry Needling, Patient/Family education, Balance training, Taping, Joint mobilization, Joint manipulation, Spinal manipulation, Spinal mobilization, Cryotherapy, and Moist heat  PLAN FOR NEXT SESSION: Review HEP and progress PRN, manual/mobs for left knee and ankle to improve motion, calf stretching, progress knee and ankle strengthening, hip strengthening, gait training   Elaine Daring, PT, DPT, LAT, ATC 09/04/24  11:25 AM Phone: [phone] Fax: [phone]

## 2024-09-04 NOTE — Patient Instructions (Signed)
 Access Code: [email or web address] URL: [email or web address] Date: 09/04/2024 Prepared by: Elaine Daring  Exercises - Standing Gastroc Stretch at Counter  - 1 x daily - 3 reps - 30 seconds hold - Seated Hamstring Stretch with Chair  - 1 x daily - 3 reps - 30seconds hold - Long Sitting Ankle Plantar Flexion with Resistance  - 1 x daily - 2-3 sets - 10 reps - Long Sitting Ankle Eversion with Resistance  - 1 x daily - 2-3 sets - 10 reps - Long Sitting Ankle Inversion with Resistance  - 1 x daily - 2-3 sets - 10 reps - Clam with Resistance  - 1 x daily - 3 sets - 10 reps - Standing Terminal Knee Extension with Resistance  - 1 x daily - 3 sets - 10 reps - Active Straight Leg Raise with Quad Set  - 1 x daily - 3 sets - 10 reps

## 2024-09-08 ENCOUNTER — Encounter: Admitting: Physical Therapy

## 2024-09-10 ENCOUNTER — Encounter: Admitting: Physical Therapy

## 2024-09-10 ENCOUNTER — Telehealth: Payer: Self-pay | Admitting: Physical Therapy

## 2024-09-10 NOTE — Telephone Encounter (Signed)
 Attempted to contact patient due to missed PT appointment. Left voicemail informing patient of missed appointment and reminder for next scheduled appointment. Offered patient appointment time for tomorrow if available and able to schedule.    Elaine Daring, PT, DPT, LAT, ATC 09/10/24  4:36 PM Phone: [phone] Fax: [phone]

## 2024-09-14 ENCOUNTER — Encounter: Payer: Self-pay | Admitting: Physical Therapy

## 2024-09-14 ENCOUNTER — Ambulatory Visit (INDEPENDENT_AMBULATORY_CARE_PROVIDER_SITE_OTHER): Admitting: Physical Therapy

## 2024-09-14 ENCOUNTER — Other Ambulatory Visit: Payer: Self-pay

## 2024-09-14 DIAGNOSIS — G8929 Other chronic pain: Secondary | ICD-10-CM

## 2024-09-14 DIAGNOSIS — M25562 Pain in left knee: Secondary | ICD-10-CM

## 2024-09-14 DIAGNOSIS — M25572 Pain in left ankle and joints of left foot: Secondary | ICD-10-CM

## 2024-09-14 DIAGNOSIS — M6281 Muscle weakness (generalized): Secondary | ICD-10-CM

## 2024-09-14 NOTE — Therapy (Signed)
 OUTPATIENT PHYSICAL THERAPY TREATMENT   Patient Name: Cindy Roman MRN: [id number] DOB:July 01, 1956, 68 y.o., female Today's Date: 09/14/2024   END OF SESSION:  PT End of Session - 09/14/24 1024     Visit Number 4    Number of Visits 17    Date for Recertification  10/20/24    Authorization Type MCR    Progress Note Due on Visit 10    PT Start Time 1017    PT Stop Time 1100    PT Time Calculation (min) 43 min    Activity Tolerance Patient tolerated treatment well    Behavior During Therapy [facility] for tasks assessed/performed             Past Medical History:  Diagnosis Date   Breast cancer (HCC) 2021   COVID 12/05/2020   Past Surgical History:  Procedure Laterality Date   ANKLE SURGERY     BREAST BIOPSY Left 2003   BREAST LUMPECTOMY Right 12/22/2020   DCIS   BREAST LUMPECTOMY WITH RADIOACTIVE SEED LOCALIZATION Right 12/22/2020   Procedure: RIGHT BREAST LUMPECTOMY WITH RADIOACTIVE SEED LOCALIZATION;  Surgeon: Ebbie Cough, MD;  Location: [facility];  Service: General;  Laterality: Right;   CHOLECYSTECTOMY     WRIST SURGERY     Patient Active Problem List   Diagnosis Date Noted   Primary osteoarthritis of left knee 08/10/2024   Genital herpes simplex 06/08/2021   Ductal carcinoma in situ (DCIS) of right breast 10/12/2020   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   Left knee pain 04/24/2018   Toe ulcer, left, limited to breakdown of skin (HCC) 04/24/2018   Unilateral primary osteoarthritis, right hip 05/20/2017   Unilateral primary osteoarthritis, right knee 05/20/2017    PCP: Claudene Pellet, MD  REFERRING PROVIDER: Joane Artist RAMAN, MD  REFERRING DIAG: Chronic pain of left knee; Chronic pain of left ankle  THERAPY DIAG:  Chronic pain of left knee  Pain in left ankle and joints of left foot  Muscle weakness (generalized)  Rationale for Evaluation and Treatment: Rehabilitation  ONSET DATE: Chronic   SUBJECTIVE:   SUBJECTIVE STATEMENT: Patient reports she still feels the pain under the knee cap when she straightens the knee.   Eval: Patient reports left knee pain after she turned while her foot was planted creating a twisting mechanism, and her ankle hurts and stiff in the morning and then will swell with activity. She feels difficulty straightening her knee with pain more in front of the knee. She also reports if she is sitting for while and then she gets up she will have generalized pain going down her legs. The left knee pain occurred back in August, and the left ankle pain has been ongoing since March, and then generalized LE pain for a while. She states she hasn't been able to exercise or participate in activity because of the pain and she doesn't feel strong enough. She does note clicking, popping, catching of both her knees.   PERTINENT HISTORY: See PMH above  PAIN:  Are you having pain? Yes:  NPRS scale: 2/10 currently, 5/10 at worst Pain location: Left knee and ankle Pain description: Throbbing Aggravating factors: Long walks, exercise, stairs, squatting Relieving factors: OTC meds, rest  PRECAUTIONS: None  PATIENT GOALS: Pain relief and improve squatting   OBJECTIVE:  Note: Objective measures were completed at Evaluation unless otherwise noted. PATIENT SURVEYS:  PSFS: 5 Walking: 7 Yoga: 6 Pickle ball: 2  EDEMA:  Mild edema observed lateral left ankle  MUSCLE LENGTH: Left calf tightness  POSTURE:   Grossly WFL  PALPATION: Tender to palpation   LOWER EXTREMITY ROM:  Active ROM Right eval Left eval  Hip flexion    Hip extension    Hip abduction    Hip adduction    Hip internal rotation    Hip external rotation    Knee flexion 128 120  Knee extension 0 Lacking 8  Ankle dorsiflexion 6 Lacking 4  Ankle plantarflexion 60 60  Ankle inversion 40 28  Ankle eversion 23 20   (Blank rows = not tested)  LOWER EXTREMITY MMT:  MMT Right eval Left eval  Hip flexion     Hip extension 4 4-  Hip abduction 4 4  Hip adduction    Hip internal rotation    Hip external rotation    Knee flexion 5 5  Knee extension 4+ 4  Ankle dorsiflexion 5 4+  Ankle plantarflexion 5 4  Ankle inversion 5 4  Ankle eversion 5 4   (Blank rows = not tested)  FUNCTIONAL TESTS:  30 seconds chair stand test: 13 reps  SLS: 30+ seconds bilaterally but she does exhibit greater difficulty and increased sway on left  GAIT: Assistive device utilized: None Level of assistance: Complete Independence Comments: Antalgic on left                                                                                                                               TREATMENT [facility] Adult PT Treatment:                                                DATE: 09/14/2024 Recumbent bike L4 x 6 min to improve her endurance and workload capacity Seated and hooklying knee joint PA and rotational mobs to improve knee extension Seated squeeze technique over lateral joint line with active assisted knee extension/flexion Supine patellar mobs all directions IASTM to lateral knee and ITB region Passive ITB stretch 2 x 30 sec Slant board calf stretch 2 x 30 sec  PATIENT EDUCATION:  Education details: HEP update Person educated: Patient Education method: Explanation, Demonstration, Tactile cues, Verbal cues, and Handouts Education comprehension: verbalized understanding, returned demonstration, verbal cues required, tactile cues required, and needs further education  HOME EXERCISE PROGRAM: Access Code: [email or web address]    ASSESSMENT: CLINICAL IMPRESSION: Patient tolerated therapy well with no adverse effects. Therapy continued to focus on improving knee mobility and working on ITB mobility with good tolerance. She does continue to have the pain behind her patella and anterolateral joint line region primarily at end range extension. Continued focus on manual therapy for joint mobility and to address any ITB  contribution to her pain. Updated her HEP to incorporated ITB stretch with good tolerance. Patient would benefit from continued skilled PT to progress mobility and strength  in order to reduce pain and maximize functional ability.   Eval: Patient is a 68 y.o. female who was seen today for physical therapy evaluation and treatment for chronic left knee and ankle pain. She demonstrates limitations in her left knee and ankle mobility, as well as limitations with her left hip and leg strength that is likely contributing to her persistent pain and impacting her functional ability.   OBJECTIVE IMPAIRMENTS: Abnormal gait, decreased activity tolerance, decreased balance, decreased ROM, decreased strength, impaired flexibility, and pain.   ACTIVITY LIMITATIONS: sitting, standing, squatting, sleeping, stairs, and locomotion level  PARTICIPATION LIMITATIONS: meal prep, cleaning, shopping, community activity, and yard work  PERSONAL FACTORS: Fitness, Past/current experiences, and Time since onset of injury/illness/exacerbation are also affecting patient's functional outcome.    GOALS: Goals reviewed with patient? Yes  SHORT TERM GOALS: Target date: 09/22/2024  Patient will be I with initial HEP in order to progress with therapy. Baseline: HEP provided at eval Goal status: INITIAL  2.  Patient will report left knee and ankle pain </= 2/10 with activity in order to reduce functional limitations Baseline: 5/10 Goal status: INITIAL  LONG TERM GOALS: Target date: 10/20/2024  Patient will be I with final HEP to maintain progress from PT. Baseline: HEP provided at eval Goal status: INITIAL  2.  Patient will report PSFS >/= 7 in order to indicate improvement in their functional ability. Baseline: 5 Goal status: INITIAL  3.  Patient will demonstrate left ankle DF >/= 5 deg in order to improve gait Baseline: see limitations above Goal status: INITIAL  4.  Patient will demonstrate left knee  extension to 0 deg in order to normalize gait and mobility Baseline: see limitations above Goal status: INITIAL  5. Patient will demonstrate knee and ankle strength 5/5 MMT in order to improve her activity tolerance  Baseline: see limitations above  Goal status: INITIAL   PLAN: PT FREQUENCY: 1-2x/week  PT DURATION: 8 weeks  PLANNED INTERVENTIONS: 97164- PT Re-evaluation, 97750- Physical Performance Testing, 97110-Therapeutic exercises, 97530- Therapeutic activity, 97112- Neuromuscular re-education, 97535- Self Care, 02859- Manual therapy, [phone]- Gait training, [phone]- Ionotophoresis 4mg /ml Dexamethasone , 79439 (1-2 muscles), 20561 (3+ muscles)- Dry Needling, Patient/Family education, Balance training, Taping, Joint mobilization, Joint manipulation, Spinal manipulation, Spinal mobilization, Cryotherapy, and Moist heat  PLAN FOR NEXT SESSION: Review HEP and progress PRN, manual/mobs for left knee and ankle to improve motion, calf stretching, progress knee and ankle strengthening, hip strengthening, gait training   Elaine Daring, PT, DPT, LAT, ATC 09/14/24  12:48 PM Phone: [phone] Fax: [phone]

## 2024-09-14 NOTE — Patient Instructions (Signed)
 Access Code: [email or web address] URL: [email or web address] Date: 09/14/2024 Prepared by: Elaine Daring  Exercises - Standing Gastroc Stretch at Counter  - 1 x daily - 3 reps - 30 seconds hold - Seated Hamstring Stretch with Chair  - 1 x daily - 3 reps - 30seconds hold - Long Sitting Ankle Plantar Flexion with Resistance  - 1 x daily - 2-3 sets - 10 reps - Long Sitting Ankle Eversion with Resistance  - 1 x daily - 2-3 sets - 10 reps - Long Sitting Ankle Inversion with Resistance  - 1 x daily - 2-3 sets - 10 reps - Clam with Resistance  - 1 x daily - 3 sets - 10 reps - Active Straight Leg Raise with Quad Set  - 1 x daily - 3 sets - 10 reps - Supine ITB Stretch with Strap  - 1 x daily - 3 reps - 30 seconds hold

## 2024-09-16 ENCOUNTER — Ambulatory Visit (INDEPENDENT_AMBULATORY_CARE_PROVIDER_SITE_OTHER): Admitting: Physical Therapy

## 2024-09-16 ENCOUNTER — Encounter: Payer: Self-pay | Admitting: Physical Therapy

## 2024-09-16 ENCOUNTER — Other Ambulatory Visit: Payer: Self-pay

## 2024-09-16 DIAGNOSIS — M25562 Pain in left knee: Secondary | ICD-10-CM

## 2024-09-16 DIAGNOSIS — G8929 Other chronic pain: Secondary | ICD-10-CM | POA: Diagnosis not present

## 2024-09-16 DIAGNOSIS — M6281 Muscle weakness (generalized): Secondary | ICD-10-CM

## 2024-09-16 DIAGNOSIS — M25572 Pain in left ankle and joints of left foot: Secondary | ICD-10-CM | POA: Diagnosis not present

## 2024-09-16 NOTE — Therapy (Signed)
 OUTPATIENT PHYSICAL THERAPY TREATMENT   Patient Name: Cindy Roman MRN: [id number] DOB:03/16/1956, 68 y.o., female Today's Date: 09/16/2024   END OF SESSION:  PT End of Session - 09/16/24 0857     Visit Number 5    Number of Visits 17    Date for Recertification  10/20/24    Authorization Type MCR    Progress Note Due on Visit 10    PT Start Time 0850    PT Stop Time 0928    PT Time Calculation (min) 38 min    Activity Tolerance Patient tolerated treatment well    Behavior During Therapy [facility] for tasks assessed/performed              Past Medical History:  Diagnosis Date   Breast cancer (HCC) 2021   COVID 12/05/2020   Past Surgical History:  Procedure Laterality Date   ANKLE SURGERY     BREAST BIOPSY Left 2003   BREAST LUMPECTOMY Right 12/22/2020   DCIS   BREAST LUMPECTOMY WITH RADIOACTIVE SEED LOCALIZATION Right 12/22/2020   Procedure: RIGHT BREAST LUMPECTOMY WITH RADIOACTIVE SEED LOCALIZATION;  Surgeon: Ebbie Cough, MD;  Location: [facility];  Service: General;  Laterality: Right;   CHOLECYSTECTOMY     WRIST SURGERY     Patient Active Problem List   Diagnosis Date Noted   Primary osteoarthritis of left knee 08/10/2024   Genital herpes simplex 06/08/2021   Ductal carcinoma in situ (DCIS) of right breast 10/12/2020   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   Left knee pain 04/24/2018   Toe ulcer, left, limited to breakdown of skin (HCC) 04/24/2018   Unilateral primary osteoarthritis, right hip 05/20/2017   Unilateral primary osteoarthritis, right knee 05/20/2017    PCP: Claudene Pellet, MD  REFERRING PROVIDER: Joane Artist RAMAN, MD  REFERRING DIAG: Chronic pain of left knee; Chronic pain of left ankle  THERAPY DIAG:  Chronic pain of left knee  Pain in left ankle and joints of left foot  Muscle weakness (generalized)  Rationale for Evaluation and Treatment: Rehabilitation  ONSET DATE: Chronic   SUBJECTIVE:   SUBJECTIVE STATEMENT: Patient reports slight improvement in knee pain following treatment last visit.  Eval: Patient reports left knee pain after she turned while her foot was planted creating a twisting mechanism, and her ankle hurts and stiff in the morning and then will swell with activity. She feels difficulty straightening her knee with pain more in front of the knee. She also reports if she is sitting for while and then she gets up she will have generalized pain going down her legs. The left knee pain occurred back in August, and the left ankle pain has been ongoing since March, and then generalized LE pain for a while. She states she hasn't been able to exercise or participate in activity because of the pain and she doesn't feel strong enough. She does note clicking, popping, catching of both her knees.   PERTINENT HISTORY: See PMH above  PAIN:  Are you having pain? Yes:  NPRS scale: 2/10 currently, 5/10 at worst Pain location: Left knee and ankle Pain description: Throbbing Aggravating factors: Long walks, exercise, stairs, squatting Relieving factors: OTC meds, rest  PRECAUTIONS: None  PATIENT GOALS: Pain relief and improve squatting   OBJECTIVE:  Note: Objective measures were completed at Evaluation unless otherwise noted. PATIENT SURVEYS:  PSFS: 5 Walking: 7 Yoga: 6 Pickle ball: 2  EDEMA:  Mild edema observed lateral left ankle  MUSCLE LENGTH: Left calf tightness  POSTURE:   Grossly WFL  PALPATION: Tender to palpation   LOWER EXTREMITY ROM:  Active ROM Right eval Left eval  Hip flexion    Hip extension    Hip abduction    Hip adduction    Hip internal rotation    Hip external rotation    Knee flexion 128 120  Knee extension 0 Lacking 8  Ankle dorsiflexion 6 Lacking 4  Ankle plantarflexion 60 60  Ankle inversion 40 28  Ankle eversion 23 20   (Blank rows = not tested)  LOWER EXTREMITY MMT:  MMT Right eval Left eval  Hip flexion    Hip  extension 4 4-  Hip abduction 4 4  Hip adduction    Hip internal rotation    Hip external rotation    Knee flexion 5 5  Knee extension 4+ 4  Ankle dorsiflexion 5 4+  Ankle plantarflexion 5 4  Ankle inversion 5 4  Ankle eversion 5 4   (Blank rows = not tested)  FUNCTIONAL TESTS:  30 seconds chair stand test: 13 reps  SLS: 30+ seconds bilaterally but she does exhibit greater difficulty and increased sway on left  GAIT: Assistive device utilized: None Level of assistance: Complete Independence Comments: Antalgic on left                                                                                                                               TREATMENT [facility] Adult PT Treatment:                                                DATE: 09/16/2024 Recumbent bike L4 x 6 min to improve her endurance and workload capacity Sidelying IASTM using FR and theragun to ITB, lateral quad and hamstring region Hooklying knee joint PA and rotational mobs to improve knee extension Supine patellar mobs all directions Passive ITB stretch 3 x 30 sec  PATIENT EDUCATION:  Education details: HEP Person educated: Patient Education method: Programmer, multimedia, Demonstration, Actor cues, Verbal cues Education comprehension: verbalized understanding, returned demonstration, verbal cues required, tactile cues required, and needs further education  HOME EXERCISE PROGRAM: Access Code: [email or web address]    ASSESSMENT: CLINICAL IMPRESSION: Patient tolerated therapy well with no adverse effects. Therapy focused more on addressing ITB and lateral thigh tightness with good tolerance and she does report improvement in the anterolateral knee and patellar pain with knee extension post session. She does remain limited with her knee extension mobility. No changes made to her HEP this visit. Patient would benefit from continued skilled PT to progress mobility and strength in order to reduce pain and maximize functional  ability.   Eval: Patient is a 68 y.o. female who was seen today for physical therapy evaluation and treatment for chronic left knee and ankle pain. She demonstrates limitations in her left  knee and ankle mobility, as well as limitations with her left hip and leg strength that is likely contributing to her persistent pain and impacting her functional ability.   OBJECTIVE IMPAIRMENTS: Abnormal gait, decreased activity tolerance, decreased balance, decreased ROM, decreased strength, impaired flexibility, and pain.   ACTIVITY LIMITATIONS: sitting, standing, squatting, sleeping, stairs, and locomotion level  PARTICIPATION LIMITATIONS: meal prep, cleaning, shopping, community activity, and yard work  PERSONAL FACTORS: Fitness, Past/current experiences, and Time since onset of injury/illness/exacerbation are also affecting patient's functional outcome.    GOALS: Goals reviewed with patient? Yes  SHORT TERM GOALS: Target date: 09/22/2024  Patient will be I with initial HEP in order to progress with therapy. Baseline: HEP provided at eval Goal status: INITIAL  2.  Patient will report left knee and ankle pain </= 2/10 with activity in order to reduce functional limitations Baseline: 5/10 Goal status: INITIAL  LONG TERM GOALS: Target date: 10/20/2024  Patient will be I with final HEP to maintain progress from PT. Baseline: HEP provided at eval Goal status: INITIAL  2.  Patient will report PSFS >/= 7 in order to indicate improvement in their functional ability. Baseline: 5 Goal status: INITIAL  3.  Patient will demonstrate left ankle DF >/= 5 deg in order to improve gait Baseline: see limitations above Goal status: INITIAL  4.  Patient will demonstrate left knee extension to 0 deg in order to normalize gait and mobility Baseline: see limitations above Goal status: INITIAL  5. Patient will demonstrate knee and ankle strength 5/5 MMT in order to improve her activity  tolerance  Baseline: see limitations above  Goal status: INITIAL   PLAN: PT FREQUENCY: 1-2x/week  PT DURATION: 8 weeks  PLANNED INTERVENTIONS: 97164- PT Re-evaluation, 97750- Physical Performance Testing, 97110-Therapeutic exercises, 97530- Therapeutic activity, 97112- Neuromuscular re-education, 97535- Self Care, 02859- Manual therapy, [phone]- Gait training, [phone]- Ionotophoresis 4mg /ml Dexamethasone , 79439 (1-2 muscles), 20561 (3+ muscles)- Dry Needling, Patient/Family education, Balance training, Taping, Joint mobilization, Joint manipulation, Spinal manipulation, Spinal mobilization, Cryotherapy, and Moist heat  PLAN FOR NEXT SESSION: Review HEP and progress PRN, manual/mobs for left knee and ankle to improve motion, calf stretching, progress knee and ankle strengthening, hip strengthening, gait training   Elaine Daring, PT, DPT, LAT, ATC 09/16/24  9:33 AM Phone: [phone] Fax: [phone]

## 2024-09-21 ENCOUNTER — Other Ambulatory Visit: Payer: Self-pay

## 2024-09-21 ENCOUNTER — Encounter: Payer: Self-pay | Admitting: Physical Therapy

## 2024-09-21 ENCOUNTER — Ambulatory Visit (INDEPENDENT_AMBULATORY_CARE_PROVIDER_SITE_OTHER): Admitting: Physical Therapy

## 2024-09-21 DIAGNOSIS — M6281 Muscle weakness (generalized): Secondary | ICD-10-CM

## 2024-09-21 DIAGNOSIS — M25562 Pain in left knee: Secondary | ICD-10-CM

## 2024-09-21 DIAGNOSIS — G8929 Other chronic pain: Secondary | ICD-10-CM | POA: Diagnosis not present

## 2024-09-21 DIAGNOSIS — M25572 Pain in left ankle and joints of left foot: Secondary | ICD-10-CM

## 2024-09-21 NOTE — Therapy (Signed)
 OUTPATIENT PHYSICAL THERAPY TREATMENT   Patient Name: Cindy Roman MRN: [id number] DOB:02-Jun-1956, 68 y.o., female Today's Date: 09/21/2024   END OF SESSION:  PT End of Session - 09/21/24 1023     Visit Number 6    Number of Visits 17    Date for Recertification  10/20/24    Authorization Type MCR    Progress Note Due on Visit 10    PT Start Time 1020    PT Stop Time 1100    PT Time Calculation (min) 40 min    Activity Tolerance Patient tolerated treatment well    Behavior During Therapy [facility] for tasks assessed/performed              Past Medical History:  Diagnosis Date   Breast cancer (HCC) 2021   COVID 12/05/2020   Past Surgical History:  Procedure Laterality Date   ANKLE SURGERY     BREAST BIOPSY Left 2003   BREAST LUMPECTOMY Right 12/22/2020   DCIS   BREAST LUMPECTOMY WITH RADIOACTIVE SEED LOCALIZATION Right 12/22/2020   Procedure: RIGHT BREAST LUMPECTOMY WITH RADIOACTIVE SEED LOCALIZATION;  Surgeon: Ebbie Cough, MD;  Location:  [facility];  Service: General;  Laterality: Right;   CHOLECYSTECTOMY     WRIST SURGERY     Patient Active Problem List   Diagnosis Date Noted   Primary osteoarthritis of left knee 08/10/2024   Genital herpes simplex 06/08/2021   Ductal carcinoma in situ (DCIS) of right breast 10/12/2020   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   Left knee pain 04/24/2018   Toe ulcer, left, limited to breakdown of skin (HCC) 04/24/2018   Unilateral primary osteoarthritis, right hip 05/20/2017   Unilateral primary osteoarthritis, right knee 05/20/2017    PCP: Claudene Pellet, MD  REFERRING PROVIDER: Joane Artist RAMAN, MD  REFERRING DIAG: Chronic pain of left knee; Chronic pain of left ankle  THERAPY DIAG:  Chronic pain of left knee  Pain in left ankle and joints of left foot  Muscle weakness (generalized)  Rationale for Evaluation and Treatment: Rehabilitation  ONSET DATE: Chronic   SUBJECTIVE:   SUBJECTIVE STATEMENT: Patient reports she still feels the knee with straightening.   Eval: Patient reports left knee pain after she turned while her foot was planted creating a twisting mechanism, and her ankle hurts and stiff in the morning and then will swell with activity. She feels difficulty straightening her knee with pain more in front of the knee. She also reports if she is sitting for while and then she gets up she will have generalized pain going down her legs. The left knee pain occurred back in August, and the left ankle pain has been ongoing since March, and then generalized LE pain for a while. She states she hasn't been able to exercise or participate in activity because of the pain and she doesn't feel strong enough. She does note clicking, popping, catching of both her knees.   PERTINENT HISTORY: See PMH above  PAIN:  Are you having pain? Yes:  NPRS scale: 2/10 currently, 5/10 at worst Pain location: Left knee and ankle Pain description: Throbbing Aggravating factors: Long walks, exercise, stairs, squatting Relieving factors: OTC meds, rest  PRECAUTIONS: None  PATIENT GOALS: Pain relief and improve squatting   OBJECTIVE:  Note: Objective measures were completed at Evaluation unless otherwise noted. PATIENT SURVEYS:  PSFS: 5 Walking: 7 Yoga: 6 Pickle ball: 2  EDEMA:  Mild edema observed lateral left ankle  MUSCLE LENGTH: Left calf tightness  POSTURE:   Grossly WFL  PALPATION: Tender to palpation   LOWER EXTREMITY ROM:  Active ROM Right eval Left eval  Hip flexion    Hip extension    Hip abduction    Hip adduction    Hip internal rotation    Hip external rotation    Knee flexion 128 120  Knee extension 0 Lacking 8  Ankle dorsiflexion 6 Lacking 4  Ankle plantarflexion 60 60  Ankle inversion 40 28  Ankle eversion 23 20   (Blank rows = not tested)  LOWER EXTREMITY MMT:  MMT Right eval Left eval  Hip flexion    Hip extension 4 4-  Hip  abduction 4 4  Hip adduction    Hip internal rotation    Hip external rotation    Knee flexion 5 5  Knee extension 4+ 4  Ankle dorsiflexion 5 4+  Ankle plantarflexion 5 4  Ankle inversion 5 4  Ankle eversion 5 4   (Blank rows = not tested)  FUNCTIONAL TESTS:  30 seconds chair stand test: 13 reps  SLS: 30+ seconds bilaterally but she does exhibit greater difficulty and increased sway on left  GAIT: Assistive device utilized: None Level of assistance: Complete Independence Comments: Antalgic on left                                                                                                                               TREATMENT [facility] Adult PT Treatment:                                                DATE: 09/21/2024 Recumbent bike L4 x 6 min to improve her endurance and workload capacity Sidelying IASTM to ITB, lateral quad and hamstring region Hooklying knee joint PA and rotational mobs to improve knee extension Supine patellar mobs all directions Supine MWM for knee extension with belt providing lateral tibial glide combined with knee extension Passive ITB stretch 3 x 30 sec Seated and standing active knee extension with MWM providing lateral glide at tibia and medial glide at femur  PATIENT EDUCATION:  Education details: HEP Person educated: Patient Education method: Programmer, multimedia, Facilities manager, Actor cues, Verbal cues Education comprehension: verbalized understanding, returned demonstration, verbal cues required, tactile cues required, and needs further education  HOME EXERCISE PROGRAM: Access Code: [email or web address]    ASSESSMENT: CLINICAL IMPRESSION: Patient tolerated therapy well with no adverse effects. Therapy continued to focus on manual therapy to improve left knee mobility especially with knee extension. She does continue to report a block at the anterolateral joint line and patellar region with end range knee extension. She did tolerate manual well and incorporated  more MWM which did reduce pain with end range knee extension. No changes made to her HEP this visit. Patient would benefit from continued skilled PT to progress  mobility and strength in order to reduce pain and maximize functional ability.   Eval: Patient is a 68 y.o. female who was seen today for physical therapy evaluation and treatment for chronic left knee and ankle pain. She demonstrates limitations in her left knee and ankle mobility, as well as limitations with her left hip and leg strength that is likely contributing to her persistent pain and impacting her functional ability.   OBJECTIVE IMPAIRMENTS: Abnormal gait, decreased activity tolerance, decreased balance, decreased ROM, decreased strength, impaired flexibility, and pain.   ACTIVITY LIMITATIONS: sitting, standing, squatting, sleeping, stairs, and locomotion level  PARTICIPATION LIMITATIONS: meal prep, cleaning, shopping, community activity, and yard work  PERSONAL FACTORS: Fitness, Past/current experiences, and Time since onset of injury/illness/exacerbation are also affecting patient's functional outcome.    GOALS: Goals reviewed with patient? Yes  SHORT TERM GOALS: Target date: 09/22/2024  Patient will be I with initial HEP in order to progress with therapy. Baseline: HEP provided at eval Goal status: INITIAL  2.  Patient will report left knee and ankle pain </= 2/10 with activity in order to reduce functional limitations Baseline: 5/10 Goal status: INITIAL  LONG TERM GOALS: Target date: 10/20/2024  Patient will be I with final HEP to maintain progress from PT. Baseline: HEP provided at eval Goal status: INITIAL  2.  Patient will report PSFS >/= 7 in order to indicate improvement in their functional ability. Baseline: 5 Goal status: INITIAL  3.  Patient will demonstrate left ankle DF >/= 5 deg in order to improve gait Baseline: see limitations above Goal status: INITIAL  4.  Patient will demonstrate left  knee extension to 0 deg in order to normalize gait and mobility Baseline: see limitations above Goal status: INITIAL  5. Patient will demonstrate knee and ankle strength 5/5 MMT in order to improve her activity tolerance  Baseline: see limitations above  Goal status: INITIAL   PLAN: PT FREQUENCY: 1-2x/week  PT DURATION: 8 weeks  PLANNED INTERVENTIONS: 97164- PT Re-evaluation, 97750- Physical Performance Testing, 97110-Therapeutic exercises, 97530- Therapeutic activity, 97112- Neuromuscular re-education, 97535- Self Care, 02859- Manual therapy, [phone]- Gait training, [phone]- Ionotophoresis 4mg /ml Dexamethasone , 79439 (1-2 muscles), 20561 (3+ muscles)- Dry Needling, Patient/Family education, Balance training, Taping, Joint mobilization, Joint manipulation, Spinal manipulation, Spinal mobilization, Cryotherapy, and Moist heat  PLAN FOR NEXT SESSION: Review HEP and progress PRN, manual/mobs for left knee and ankle to improve motion, calf stretching, progress knee and ankle strengthening, hip strengthening, gait training   Elaine Daring, PT, DPT, LAT, ATC 09/21/24  11:17 AM Phone: [phone] Fax: [phone]

## 2024-09-23 ENCOUNTER — Encounter: Admitting: Physical Therapy

## 2024-09-28 ENCOUNTER — Other Ambulatory Visit: Payer: Self-pay

## 2024-09-28 ENCOUNTER — Ambulatory Visit (INDEPENDENT_AMBULATORY_CARE_PROVIDER_SITE_OTHER): Admitting: Physical Therapy

## 2024-09-28 ENCOUNTER — Encounter: Payer: Self-pay | Admitting: Physical Therapy

## 2024-09-28 DIAGNOSIS — G8929 Other chronic pain: Secondary | ICD-10-CM

## 2024-09-28 DIAGNOSIS — M25562 Pain in left knee: Secondary | ICD-10-CM | POA: Diagnosis not present

## 2024-09-28 DIAGNOSIS — M25572 Pain in left ankle and joints of left foot: Secondary | ICD-10-CM | POA: Diagnosis not present

## 2024-09-28 DIAGNOSIS — M6281 Muscle weakness (generalized): Secondary | ICD-10-CM | POA: Diagnosis not present

## 2024-09-28 NOTE — Therapy (Signed)
 OUTPATIENT PHYSICAL THERAPY TREATMENT   Patient Name: GENEE RANN MRN: [id number] DOB:Sep 21, 1956, 68 y.o., female Today's Date: 09/28/2024   END OF SESSION:  PT End of Session - 09/28/24 0807     Visit Number 7    Number of Visits 17    Date for Recertification  10/20/24    Authorization Type MCR    Progress Note Due on Visit 10    PT Start Time 0804    PT Stop Time 0845    PT Time Calculation (min) 41 min    Activity Tolerance Patient tolerated treatment well    Behavior During Therapy [facility] for tasks assessed/performed               Past Medical History:  Diagnosis Date   Breast cancer (HCC) 2021   COVID 12/05/2020   Past Surgical History:  Procedure Laterality Date   ANKLE SURGERY     BREAST BIOPSY Left 2003   BREAST LUMPECTOMY Right 12/22/2020   DCIS   BREAST LUMPECTOMY WITH RADIOACTIVE SEED LOCALIZATION Right 12/22/2020   Procedure: RIGHT BREAST LUMPECTOMY WITH RADIOACTIVE SEED LOCALIZATION;  Surgeon: Ebbie Cough, MD;  Location: [facility];  Service: General;  Laterality: Right;   CHOLECYSTECTOMY     WRIST SURGERY     Patient Active Problem List   Diagnosis Date Noted   Primary osteoarthritis of left knee 08/10/2024   Genital herpes simplex 06/08/2021   Ductal carcinoma in situ (DCIS) of right breast 10/12/2020   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   Left knee pain 04/24/2018   Toe ulcer, left, limited to breakdown of skin (HCC) 04/24/2018   Unilateral primary osteoarthritis, right hip 05/20/2017   Unilateral primary osteoarthritis, right knee 05/20/2017    PCP: Claudene Pellet, MD  REFERRING PROVIDER: Joane Artist RAMAN, MD  REFERRING DIAG: Chronic pain of left knee; Chronic pain of left ankle  THERAPY DIAG:  Chronic pain of left knee  Pain in left ankle and joints of left foot  Muscle weakness (generalized)  Rationale for Evaluation and Treatment: Rehabilitation  ONSET DATE: Chronic   SUBJECTIVE:   SUBJECTIVE STATEMENT: Patient reports she is doing well. The ankle still bothers her earlier in the morning and still has knee pain with straightening her knee.  Eval: Patient reports left knee pain after she turned while her foot was planted creating a twisting mechanism, and her ankle hurts and stiff in the morning and then will swell with activity. She feels difficulty straightening her knee with pain more in front of the knee. She also reports if she is sitting for while and then she gets up she will have generalized pain going down her legs. The left knee pain occurred back in August, and the left ankle pain has been ongoing since March, and then generalized LE pain for a while. She states she hasn't been able to exercise or participate in activity because of the pain and she doesn't feel strong enough. She does note clicking, popping, catching of both her knees.   PERTINENT HISTORY: See PMH above  PAIN:  Are you having pain? Yes:  NPRS scale: 2/10 currently, 5/10 at worst Pain location: Left knee and ankle Pain description: Throbbing Aggravating factors: Long walks, exercise, stairs, squatting Relieving factors: OTC meds, rest  PRECAUTIONS: None  PATIENT GOALS: Pain relief and improve squatting   OBJECTIVE:  Note: Objective measures were completed at Evaluation unless otherwise noted. PATIENT SURVEYS:  PSFS: 5 Walking: 7 Yoga: 6 Pickle ball: 2  EDEMA:  Mild edema observed lateral left ankle  MUSCLE LENGTH: Left calf tightness  POSTURE:   Grossly WFL  PALPATION: Tender to palpation   LOWER EXTREMITY ROM:  Active ROM Right eval Left eval  Hip flexion    Hip extension    Hip abduction    Hip adduction    Hip internal rotation    Hip external rotation    Knee flexion 128 120  Knee extension 0 Lacking 8  Ankle dorsiflexion 6 Lacking 4  Ankle plantarflexion 60 60  Ankle inversion 40 28  Ankle eversion 23 20   (Blank rows = not tested)  LOWER EXTREMITY  MMT:  MMT Right eval Left eval  Hip flexion    Hip extension 4 4-  Hip abduction 4 4  Hip adduction    Hip internal rotation    Hip external rotation    Knee flexion 5 5  Knee extension 4+ 4  Ankle dorsiflexion 5 4+  Ankle plantarflexion 5 4  Ankle inversion 5 4  Ankle eversion 5 4   (Blank rows = not tested)  FUNCTIONAL TESTS:  30 seconds chair stand test: 13 reps  SLS: 30+ seconds bilaterally but she does exhibit greater difficulty and increased sway on left  GAIT: Assistive device utilized: None Level of assistance: Complete Independence Comments: Antalgic on left                                                                                                                               TREATMENT [facility] Adult PT Treatment:                                                DATE: 09/28/2024 Recumbent bike L4 x 5 min to improve her endurance and workload capacity Slant board calf stretch 3 x 30 sec Seated, hooklying, and supine knee joint mobs to improve extension Supine patellar mobs all directions Sidelying IASTM to ITB, lateral quad and hamstring region Passive ITB stretch 3 x 30 sec Supine quad set 2 x 10 x 5 sec  PATIENT EDUCATION:  Education details: HEP Person educated: Patient Education method: Programmer, Multimedia, Demonstration, Actor cues, Verbal cues Education comprehension: verbalized understanding, returned demonstration, verbal cues required, tactile cues required, and needs further education  HOME EXERCISE PROGRAM: Access Code: [email or web address]    ASSESSMENT: CLINICAL IMPRESSION: Patient tolerated therapy well with no adverse effects. Therapy continued to focus on improving left knee mobility and tolerance for extension. She continues to report anterolateral joint line knee pain with end range extension but does report improvement in her symptoms following therapy. No changes made to her HEP this visit. Patient would benefit from continued skilled PT to progress  mobility and strength in order to reduce pain and maximize functional ability.   Eval: Patient is a 68 y.o. female  who was seen today for physical therapy evaluation and treatment for chronic left knee and ankle pain. She demonstrates limitations in her left knee and ankle mobility, as well as limitations with her left hip and leg strength that is likely contributing to her persistent pain and impacting her functional ability.   OBJECTIVE IMPAIRMENTS: Abnormal gait, decreased activity tolerance, decreased balance, decreased ROM, decreased strength, impaired flexibility, and pain.   ACTIVITY LIMITATIONS: sitting, standing, squatting, sleeping, stairs, and locomotion level  PARTICIPATION LIMITATIONS: meal prep, cleaning, shopping, community activity, and yard work  PERSONAL FACTORS: Fitness, Past/current experiences, and Time since onset of injury/illness/exacerbation are also affecting patient's functional outcome.    GOALS: Goals reviewed with patient? Yes  SHORT TERM GOALS: Target date: 09/22/2024  Patient will be I with initial HEP in order to progress with therapy. Baseline: HEP provided at eval 09/28/2024: independent Goal status: MET  2.  Patient will report left knee and ankle pain </= 2/10 with activity in order to reduce functional limitations Baseline: 5/10 09/28/2024: continues to report knee pain Goal status: ONGOING  LONG TERM GOALS: Target date: 10/20/2024  Patient will be I with final HEP to maintain progress from PT. Baseline: HEP provided at eval Goal status: INITIAL  2.  Patient will report PSFS >/= 7 in order to indicate improvement in their functional ability. Baseline: 5 Goal status: INITIAL  3.  Patient will demonstrate left ankle DF >/= 5 deg in order to improve gait Baseline: see limitations above Goal status: INITIAL  4.  Patient will demonstrate left knee extension to 0 deg in order to normalize gait and mobility Baseline: see limitations  above Goal status: INITIAL  5. Patient will demonstrate knee and ankle strength 5/5 MMT in order to improve her activity tolerance  Baseline: see limitations above  Goal status: INITIAL   PLAN: PT FREQUENCY: 1-2x/week  PT DURATION: 8 weeks  PLANNED INTERVENTIONS: 97164- PT Re-evaluation, 97750- Physical Performance Testing, 97110-Therapeutic exercises, 97530- Therapeutic activity, 97112- Neuromuscular re-education, 97535- Self Care, 02859- Manual therapy, [phone]- Gait training, [phone]- Ionotophoresis 4mg /ml Dexamethasone , 79439 (1-2 muscles), 20561 (3+ muscles)- Dry Needling, Patient/Family education, Balance training, Taping, Joint mobilization, Joint manipulation, Spinal manipulation, Spinal mobilization, Cryotherapy, and Moist heat  PLAN FOR NEXT SESSION: Review HEP and progress PRN, manual/mobs for left knee and ankle to improve motion, calf stretching, progress knee and ankle strengthening, hip strengthening, gait training   Elaine Daring, PT, DPT, LAT, ATC 09/28/24  9:19 AM Phone: [phone] Fax: [phone]

## 2024-09-30 ENCOUNTER — Encounter: Admitting: Physical Therapy

## 2024-10-02 ENCOUNTER — Other Ambulatory Visit: Payer: Self-pay

## 2024-10-02 ENCOUNTER — Encounter: Payer: Self-pay | Admitting: Physical Therapy

## 2024-10-02 ENCOUNTER — Ambulatory Visit (INDEPENDENT_AMBULATORY_CARE_PROVIDER_SITE_OTHER): Admitting: Physical Therapy

## 2024-10-02 DIAGNOSIS — M25562 Pain in left knee: Secondary | ICD-10-CM

## 2024-10-02 DIAGNOSIS — G8929 Other chronic pain: Secondary | ICD-10-CM

## 2024-10-02 DIAGNOSIS — M6281 Muscle weakness (generalized): Secondary | ICD-10-CM | POA: Diagnosis not present

## 2024-10-02 DIAGNOSIS — M25572 Pain in left ankle and joints of left foot: Secondary | ICD-10-CM | POA: Diagnosis not present

## 2024-10-02 NOTE — Patient Instructions (Signed)
 Access Code: [email or web address] URL: [email or web address] Date: 10/02/2024 Prepared by: Elaine Daring  Exercises - Supine Sciatic Nerve Glide  - 1 x daily - 3 sets - 10 reps - Supine Figure 4 Piriformis Stretch  - 1 x daily - 3 reps - 15 seconds hold - Supine Piriformis Stretch with Foot on Ground  - 1 x daily - 3 reps - 15 seconds hold - Alternating Single Leg Bridge  - 3 x weekly - 3 sets - 5 reps - 5 hold - Clam with Resistance  - 3 x weekly - 3 sets - 12 reps - Supine 90/90 Alternating Heel Touches with Posterior Pelvic Tilt  - 3 x weekly - 3 sets - 10 reps

## 2024-10-02 NOTE — Progress Notes (Signed)
   [name], PhD, LAT, ATC acting as a scribe for Artist Lloyd, MD.  Cindy Roman is a 68 y.o. female who presents to [facility] today for 73-month f/u L knee and L ankle pain. Pt was last seen by Dr. Lloyd on 08/10/24 and was advised to wear an ankle compression sleeve, use Voltaren gel, and was referred to PT, completing 8 visits.  Today, pt reports L ankle is still stiff in the morning. She finds the compressive sleeve helpful. L knee pain is around the joint line and deep to the patella. She notes limited full knee eXt.  Dx imaging: 08/10/24 L ankle & L knee XR  Pertinent review of systems: No fevers or chills  Relevant historical information: History of breast cancer Husband is having hip replacement surgery at the end of this week.  Exam:  BP (!) 146/82   Pulse 77   Ht 5' 7 (1.702 m)   SpO2 97%   BMI 28.63 kg/m  General: Well Developed, well nourished, and in no acute distress.   MSK: Left knee mild effusion normal-appearing otherwise normal motion.    Lab and Radiology Results  Procedure: Real-time Ultrasound Guided Injection of left knee joint superior lateral patella space Device: Philips Affiniti 50G/GE Logiq Images permanently stored and available for review in PACS Verbal informed consent obtained.  Discussed risks and benefits of procedure. Warned about infection, bleeding, hyperglycemia damage to structures among others. Patient expresses understanding and agreement Time-out conducted.   Noted no overlying erythema, induration, or other signs of local infection.   Skin prepped in a sterile fashion.   Local anesthesia: Topical Ethyl chloride.   With sterile technique and under real time ultrasound guidance: 40 mg of Depo-Medrol  and 2 mL of Marcaine  injected into knee joint. Fluid seen entering the joint capsule.   Completed without difficulty   Pain immediately resolved suggesting accurate placement of the medication.    Advised to call if fevers/chills, erythema, induration, drainage, or persistent bleeding.   Images permanently stored and available for review in the ultrasound unit.  Impression: Technically successful ultrasound guided injection.      Assessment and Plan: 68 y.o. female with Left knee and left ankle pain improving with PT.  Knee pain is still bothersome.  Plan for injection today. Ankle continue home exercise program. Check back if not improving consider advanced imaging such as MRI for both structures if not improving.  PDMP not reviewed this encounter. Orders Placed This Encounter  Procedures   US  LIMITED JOINT SPACE STRUCTURES LOW LEFT(NO LINKED CHARGES)    Reason for Exam (SYMPTOM  OR DIAGNOSIS REQUIRED):   left knee pain    Preferred imaging location?:   [facility]   Meds ordered this encounter  Medications   methylPREDNISolone  acetate (DEPO-MEDROL ) injection 40 mg     Discussed warning signs or symptoms. Please see discharge instructions. Patient expresses understanding.   The above documentation has been reviewed and is accurate and complete Artist Lloyd, M.D.

## 2024-10-02 NOTE — Therapy (Addendum)
 " OUTPATIENT PHYSICAL THERAPY TREATMENT  DISCHARGE   Patient Name: Cindy Roman MRN: [id number] DOB:[birth year], 68 y.o., female Today's Date: 10/02/2024   END OF SESSION:  PT End of Session - 10/02/24 0810     Visit Number 8    Number of Visits 17    Date for Recertification  10/20/24    Authorization Type MCR    Progress Note Due on Visit 10    PT Start Time 0806    PT Stop Time 0845    PT Time Calculation (min) 39 min    Activity Tolerance Patient tolerated treatment well    Behavior During Therapy [facility] for tasks assessed/performed                Past Medical History:  Diagnosis Date   Breast cancer (HCC) 2021   COVID 12/05/2020   Past Surgical History:  Procedure Laterality Date   ANKLE SURGERY     BREAST BIOPSY Left 2003   BREAST LUMPECTOMY Right 12/22/2020   DCIS   BREAST LUMPECTOMY WITH RADIOACTIVE SEED LOCALIZATION Right 12/22/2020   Procedure: RIGHT BREAST LUMPECTOMY WITH RADIOACTIVE SEED LOCALIZATION;  Surgeon: Ebbie Cough, MD;  Location: [facility];  Service: General;  Laterality: Right;   CHOLECYSTECTOMY     WRIST SURGERY     Patient Active Problem List   Diagnosis Date Noted   Primary osteoarthritis of left knee 08/10/2024   Genital herpes simplex 06/08/2021   Ductal carcinoma in situ (DCIS) of right breast 10/12/2020   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   Left knee pain 04/24/2018   Toe ulcer, left, limited to breakdown of skin (HCC) 04/24/2018   Unilateral primary osteoarthritis, right hip 05/20/2017   Unilateral primary osteoarthritis, right knee 05/20/2017    PCP: Claudene Pellet, MD  REFERRING PROVIDER: Joane Artist RAMAN, MD  REFERRING DIAG: Chronic pain of left knee; Chronic pain of left ankle  THERAPY DIAG:  Chronic pain of left knee  Pain in left ankle and joints of left foot  Muscle weakness (generalized)  Rationale for Evaluation and Treatment: Rehabilitation  ONSET DATE:  Chronic   SUBJECTIVE:  SUBJECTIVE STATEMENT: Patient reports she is doing well. She continues to have the left knee pain when straightening her knee.  Eval: Patient reports left knee pain after she turned while her foot was planted creating a twisting mechanism, and her ankle hurts and stiff in the morning and then will swell with activity. She feels difficulty straightening her knee with pain more in front of the knee. She also reports if she is sitting for while and then she gets up she will have generalized pain going down her legs. The left knee pain occurred back in August, and the left ankle pain has been ongoing since March, and then generalized LE pain for a while. She states she hasn't been able to exercise or participate in activity because of the pain and she doesn't feel strong enough. She does note clicking, popping, catching of both her knees.   PERTINENT HISTORY: See PMH above  PAIN:  Are you having pain? Yes:  NPRS scale: 2/10 currently, 5/10 at worst Pain location: Left knee and ankle Pain description: Throbbing Aggravating factors: Long walks, exercise, stairs, squatting Relieving factors: OTC meds, rest  PRECAUTIONS: None  PATIENT GOALS: Pain relief and improve squatting   OBJECTIVE:  Note: Objective measures were completed at Evaluation unless otherwise noted. PATIENT SURVEYS:  PSFS: 5 Walking: 7 Yoga: 6 Pickle ball: 2  EDEMA:  Mild edema observed lateral left ankle  MUSCLE LENGTH: Left calf tightness  POSTURE:   Grossly WFL  PALPATION: Tender to palpation   LOWER EXTREMITY ROM:  Active ROM Right eval Left eval  Hip flexion    Hip extension    Hip abduction    Hip adduction    Hip internal rotation    Hip external rotation    Knee flexion 128 120  Knee extension 0 Lacking 8  Ankle dorsiflexion 6 Lacking 4  Ankle plantarflexion 60 60  Ankle inversion 40 28  Ankle eversion 23 20   (Blank rows = not tested)  LOWER EXTREMITY MMT:  MMT  Right eval Left eval  Hip flexion    Hip extension 4 4-  Hip abduction 4 4  Hip adduction    Hip internal rotation    Hip external rotation    Knee flexion 5 5  Knee extension 4+ 4  Ankle dorsiflexion 5 4+  Ankle plantarflexion 5 4  Ankle inversion 5 4  Ankle eversion 5 4   (Blank rows = not tested)  FUNCTIONAL TESTS:  30 seconds chair stand test: 13 reps  SLS: 30+ seconds bilaterally but she does exhibit greater difficulty and increased sway on left  GAIT: Assistive device utilized: None Level of assistance: Complete Independence Comments: Antalgic on left                                                                                                                               TREATMENT [facility] Adult PT Treatment:                                                DATE: 10/02/2024 Recumbent bike L4 x 5 min to improve her endurance and workload capacity Slant board calf stretch 3 x 30 sec Standing heel raise with ball between heels 3 x 10 Seated and hooklying knee joint mobs to improve extension Modified thomas stretch 3 x 30 sec left Supine patellar mobs all directions Passive ITB stretch 3 x 30 sec Supine quad set 2 x 10 x 5 sec  PATIENT EDUCATION:  Education details: HEP Person educated: Patient Education method: Programmer, Multimedia, Demonstration, Actor cues, Verbal cues Education comprehension: verbalized understanding, returned demonstration, verbal cues required, tactile cues required, and needs further education  HOME EXERCISE PROGRAM: Access Code: [email or web address]    ASSESSMENT: CLINICAL IMPRESSION: Patient tolerated therapy well with no adverse effects. Therapy continues to focus on improving her knee mobility to reduce pain with knee extension and progressed her ankle strengthening this visit. She does report minimal improvement overall but continues to have anterolateral joint line knee pain with end range knee extension and reports her ankle primarily bothers her in the  morning. She tolerates therapy well and reports improvement in symptoms following treatment. No changes made to  her HEP this visit. Patient would benefit from continued skilled PT to progress mobility and strength in order to reduce pain and maximize functional ability.   Eval: Patient is a 68 y.o. female who was seen today for physical therapy evaluation and treatment for chronic left knee and ankle pain. She demonstrates limitations in her left knee and ankle mobility, as well as limitations with her left hip and leg strength that is likely contributing to her persistent pain and impacting her functional ability.   OBJECTIVE IMPAIRMENTS: Abnormal gait, decreased activity tolerance, decreased balance, decreased ROM, decreased strength, impaired flexibility, and pain.   ACTIVITY LIMITATIONS: sitting, standing, squatting, sleeping, stairs, and locomotion level  PARTICIPATION LIMITATIONS: meal prep, cleaning, shopping, community activity, and yard work  PERSONAL FACTORS: Fitness, Past/current experiences, and Time since onset of injury/illness/exacerbation are also affecting patient's functional outcome.    GOALS: Goals reviewed with patient? Yes  SHORT TERM GOALS: Target date: 09/22/2024  Patient will be I with initial HEP in order to progress with therapy. Baseline: HEP provided at eval 09/28/2024: independent Goal status: MET  2.  Patient will report left knee and ankle pain </= 2/10 with activity in order to reduce functional limitations Baseline: 5/10 09/28/2024: continues to report knee pain Goal status: ONGOING  LONG TERM GOALS: Target date: 10/20/2024  Patient will be I with final HEP to maintain progress from PT. Baseline: HEP provided at eval Goal status: INITIAL  2.  Patient will report PSFS >/= 7 in order to indicate improvement in their functional ability. Baseline: 5 Goal status: INITIAL  3.  Patient will demonstrate left ankle DF >/= 5 deg in order to improve  gait Baseline: see limitations above Goal status: INITIAL  4.  Patient will demonstrate left knee extension to 0 deg in order to normalize gait and mobility Baseline: see limitations above Goal status: INITIAL  5. Patient will demonstrate knee and ankle strength 5/5 MMT in order to improve her activity tolerance  Baseline: see limitations above  Goal status: INITIAL   PLAN: PT FREQUENCY: 1-2x/week  PT DURATION: 8 weeks  PLANNED INTERVENTIONS: 97164- PT Re-evaluation, 97750- Physical Performance Testing, 97110-Therapeutic exercises, 97530- Therapeutic activity, 97112- Neuromuscular re-education, 97535- Self Care, 02859- Manual therapy, [phone]- Gait training, [phone]- Ionotophoresis 4mg /ml Dexamethasone , 79439 (1-2 muscles), 20561 (3+ muscles)- Dry Needling, Patient/Family education, Balance training, Taping, Joint mobilization, Joint manipulation, Spinal manipulation, Spinal mobilization, Cryotherapy, and Moist heat  PLAN FOR NEXT SESSION: Review HEP and progress PRN, manual/mobs for left knee and ankle to improve motion, calf stretching, progress knee and ankle strengthening, hip strengthening, gait training   Elaine Daring, PT, DPT, LAT, ATC 10/02/24  8:39 AM Phone: [phone] Fax: [phone]    PHYSICAL THERAPY DISCHARGE SUMMARY  Visits from Start of Care: 8  Current functional level related to goals / functional outcomes: See above   Remaining deficits: See above   Education / Equipment: HEP   Patient agrees to discharge. Patient goals were partially met. Patient is being discharged due to not returning since the last visit.  Elaine Daring, PT, DPT, LAT, ATC 12/16/2024  1:36 PM Phone: [phone] Fax: [phone]   "

## 2024-10-05 ENCOUNTER — Other Ambulatory Visit: Payer: Self-pay

## 2024-10-05 ENCOUNTER — Ambulatory Visit: Admitting: Family Medicine

## 2024-10-05 VITALS — BP 146/82 | HR 77 | Ht 67.0 in

## 2024-10-05 DIAGNOSIS — M25562 Pain in left knee: Secondary | ICD-10-CM | POA: Diagnosis not present

## 2024-10-05 DIAGNOSIS — M25572 Pain in left ankle and joints of left foot: Secondary | ICD-10-CM

## 2024-10-05 DIAGNOSIS — G8929 Other chronic pain: Secondary | ICD-10-CM | POA: Diagnosis not present

## 2024-10-05 MED ORDER — METHYLPREDNISOLONE ACETATE 40 MG/ML IJ SUSP
40.0000 mg | Freq: Once | INTRAMUSCULAR | Status: AC
  Administered 2024-10-05: 40 mg via INTRA_ARTICULAR

## 2024-10-05 NOTE — Patient Instructions (Addendum)
 Thank you for coming in today.  You received an injection today. Seek immediate medical attention if the joint becomes red, extremely painful, or is oozing fluid.   Continue home exercises  Check back as needed

## 2024-10-07 ENCOUNTER — Encounter: Admitting: Physical Therapy

## 2024-10-14 ENCOUNTER — Encounter: Admitting: Physical Therapy

## 2024-10-20 ENCOUNTER — Encounter: Admitting: Physical Therapy

## 2024-11-10 ENCOUNTER — Inpatient Hospital Stay: Admission: RE | Admit: 2024-11-10 | Discharge: 2024-11-10 | Attending: Hematology and Oncology

## 2024-11-10 DIAGNOSIS — D0511 Intraductal carcinoma in situ of right breast: Secondary | ICD-10-CM

## 2024-11-16 ENCOUNTER — Other Ambulatory Visit

## 2024-11-16 ENCOUNTER — Encounter: Payer: Self-pay | Admitting: *Deleted

## 2024-11-16 ENCOUNTER — Inpatient Hospital Stay: Attending: Hematology and Oncology | Admitting: Hematology and Oncology

## 2024-11-16 VITALS — BP 132/70 | HR 60 | Temp 98.0°F | Resp 16 | Ht 67.0 in | Wt 184.9 lb

## 2024-11-16 DIAGNOSIS — D0511 Intraductal carcinoma in situ of right breast: Secondary | ICD-10-CM

## 2024-11-16 DIAGNOSIS — Z79810 Long term (current) use of selective estrogen receptor modulators (SERMs): Secondary | ICD-10-CM | POA: Insufficient documentation

## 2024-11-16 DIAGNOSIS — E039 Hypothyroidism, unspecified: Secondary | ICD-10-CM | POA: Insufficient documentation

## 2024-11-16 DIAGNOSIS — D0512 Intraductal carcinoma in situ of left breast: Secondary | ICD-10-CM | POA: Insufficient documentation

## 2024-11-16 DIAGNOSIS — R896 Abnormal cytological findings in specimens from other organs, systems and tissues: Secondary | ICD-10-CM | POA: Diagnosis not present

## 2024-11-16 DIAGNOSIS — Z006 Encounter for examination for normal comparison and control in clinical research program: Secondary | ICD-10-CM | POA: Diagnosis not present

## 2024-11-16 LAB — RESEARCH LABS

## 2024-11-16 NOTE — Research (Signed)
 AFT - 25: COMPARING AN OPERATION TO MONITORING, WITH OR WITHOUT ENDOCRINE THERAPY (COMET) FOR LOW RISK DCIS: A PHASE III PROSPECTIVE RANDOMIZED TRIAL   48- month study visit    The pt was into the cancer center this morning unaccompanied for her month 48 study visit.  The pt states she recently celebrated Christmas early with her family.  She confirmed that she is still taking tamoxifen  every day.  The pt was seen and examined by Dr. Odean.   Research labs - Research samples obtained today without incident.  Samples to be shipped today.     Mammogram: Bilateral mammogram obtained on 11/10/24. Radiologist impression:  No mammographic evidence of malignancy bilaterally.    Questionnaires:  The pt completed her online questionnaires prior to today's visit.   The pt was thanked for her completing her questionnaires in a timely manner.     Solicited and Other/Adverse Events: Reviewed with patient and documented below.  The pt confirmed that she has ongoingmoderate arthralgia, mainly her right hip (grade 2).  She also reports ongoing, mild hot flashes (grade 1).  Research nurse obtained the following attributions from Dr. Odean after the pt's visit on 11/16/24.    Event Grade Onset Date Resolved Date Drug Name Attribution Treatment Comments  Arthralgia  Grade 2 11/12/22 Ongoing  Tamoxifen  Possible   managing with dietary changes  Mainly right hip pain.   Hot flashes Grade 1 11/11/23 Ongoing  Tamoxifen  Possible  None reported  Mild, per patient report  Not evaluated (N/E)Solicited AE's include the following:  osteoporosis (no recent bone density exam) Pt denied the following Solicited AE's:  allergic reaction, fever, myalgia, acute coronary syndrome, ischemia cerebrovascular, hypertension, nausea, cholesterol, and fracture   Plan: Mrs. Kallstrom will be scheduled to return in approximately 6 months for MD visit in June 2026.   I thanked patient for her continued participation and support of study.     Levon FREDRIK Sandifer RN, BSN, CCRP Clinical Research Nurse Lead 11/16/2024 12:04 PM

## 2024-11-16 NOTE — Progress Notes (Signed)
 Patient Care Team: Claudene Pellet, MD as PCP - General (Family Medicine) Tyree Nanetta SAILOR, RN as Oncology Nurse Navigator  DIAGNOSIS:  Encounter Diagnosis  Name Primary?   Ductal carcinoma in situ (DCIS) of right breast Yes    SUMMARY OF ONCOLOGIC HISTORY: Oncology History  Ductal carcinoma in situ (DCIS) of right breast  10/12/2020 Initial Diagnosis   Screening mammogram showed right breast calcifications, spanning 2.0cm. 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    10/12/2020 Cancer Staging   Staging form: Breast, AJCC 8th Edition - Clinical stage from 10/12/2020: Stage 0 (cTis (DCIS), cN0, cM0, ER+, PR+) - Signed by Crawford Morna Pickle, NP on 10/12/2020   12/22/2020 Surgery   Right lumpectomy [facility]tor[facility]): intermediate grade DCIS, 1.2cm, clear margins (COMET clinical trial)   01/2021 -  Anti-estrogen oral therapy   Adjuvant tamoxifen  (patient refused radiation)     CHIEF COMPLIANT: Surveillance of breast cancer on Comet clinical trial  HISTORY OF PRESENT ILLNESS:  History of Present Illness Cindy Roman is a 68 year old female with ER+/PR+ low-grade ductal carcinoma in situ of the right breast, status post lumpectomy, who presents for a four-year follow-up as part of a clinical study.  She is in her fourth year of daily tamoxifen , started in 2022 after right lumpectomy. She did not receive adjuvant radiation.  She tolerates tamoxifen  with mild hot flashes and intermittent hip bone soreness that improve with dietary changes. She denies mood changes. She is working with a naturopathic provider and recently had bloodwork for gastrointestinal concerns.  Her most recent mammogram shows stable low breast density (category B). She remains physically active and focused on maintaining mobility and independence.       ALLERGIES:  is allergic to penicillins.  MEDICATIONS:  Current Outpatient Medications   Medication Sig Dispense Refill   levothyroxine (SYNTHROID) 50 MCG tablet Take 50 mcg by mouth daily before breakfast.     Multiple Vitamins-Minerals (PRESERVISION AREDS PO) Take by mouth.     Nutritional Supplements (JUICE PLUS FIBRE PO) Take by mouth.     tamoxifen  (NOLVADEX ) 20 MG tablet Take 1 tablet (20 mg total) by mouth daily. 90 tablet 3   No current facility-administered medications for this visit.    PHYSICAL EXAMINATION: ECOG PERFORMANCE STATUS: 1 - Symptomatic but completely ambulatory  Vitals:   11/16/24 1033  BP: 132/70  Pulse: 60  Resp: 16  Temp: 98 F (36.7 C)  SpO2: 100%   Filed Weights   11/16/24 1033  Weight: 184 lb 14.4 oz (83.9 kg)    Physical Exam Breast exam: Bilateral breast exam: No palpable lumps or nodules  (exam performed in the presence of a chaperone)  LABORATORY DATA:  I have reviewed the data as listed    Latest Ref Rng & Units 05/03/2009    8:50 AM  CMP  Glucose 70 - 99 mg/dL 869   BUN 6 - 23 mg/dL 11   Creatinine 0.4 - 1.2 mg/dL 9.18   Sodium 864 - 854 mEq/L 138   Potassium 3.5 - 5.1 mEq/L 4.8   Chloride 96 - 112 mEq/L 105   CO2 19 - 32 mEq/L 27   Calcium 8.4 - 10.5 mg/dL 9.4   Total Protein 6.0 - 8.3 g/dL 6.7   Total Bilirubin 0.3 - 1.2 mg/dL 0.7   Alkaline Phos 39 - 117 U/L 69   AST 0 - 37 U/L 22   ALT 0 - 35  U/L 25     Lab Results  Component Value Date   WBC 5.7 05/03/2009   HGB 12.9 05/03/2009   HCT 38.5 05/03/2009   MCV 93.4 05/03/2009   PLT 174 05/03/2009   NEUTROABS 3.0 05/03/2009    ASSESSMENT & PLAN:  Ductal carcinoma in situ (DCIS) of right breast 12/22/2020: Right lumpectomy [facility]toria): Intermediate grade DCIS, 1.2 cm, clear margins, ER 95%, PR 40% Tis NX stage 0   COMET clinical trial: Randomized to surgery   Treatment plan: 1. Patient refused adjuvant radiation 2. Adjuvant antiestrogen therapy with tamoxifen  x5 years started February 2022   Tamoxifen  toxicities:  Mild hot flashes Mild joint  stiffness especially in bilateral hips    Slight fatigue: Secondary to hypothyroidism Abnormal Pap smear: Was prescribed estrogen cream.    Breast cancer surveillance: Mammogram 11/10/2024: Benign Density Cat B Breast exam 11/16/2024: Benign   Patient's daughter lives in [place]  Return to clinic in 6 months      No orders of the defined types were placed in this encounter.  The patient has a good understanding of the overall plan. she agrees with it. she will call with any problems that may develop before the next visit here.  I personally spent a total of 30 minutes in the care of the patient today including preparing to see the patient, getting/reviewing separately obtained history, performing a medically appropriate exam/evaluation, counseling and educating, placing orders, referring and communicating with other health care professionals, documenting clinical information in the EHR, independently interpreting results, communicating results, and coordinating care.   Viinay K Katelynd Blauvelt, MD 11/16/2024

## 2024-11-16 NOTE — Assessment & Plan Note (Signed)
 12/22/2020: Right lumpectomy Viktoria): Intermediate grade DCIS, 1.2 cm, clear margins, ER 95%, PR 40% Tis NX stage 0   COMET clinical trial: Randomized to surgery   Treatment plan: 1. Patient refused adjuvant radiation 2. Adjuvant antiestrogen therapy with tamoxifen  x5 years started February 2022   Tamoxifen  toxicities:  Intermittent muscle cramps that wake her up at night: Resolved after taking magnesium supplement   Slight fatigue: Secondary to hypothyroidism Abnormal Pap smear: Was prescribed estrogen cream.    Breast cancer surveillance: Mammogram 11/10/2024: Benign Density Cat B Breast exam 11/16/2024: Benign   Patient's daughter lives in [place]  Return to clinic in 6 months

## 2024-12-31 ENCOUNTER — Other Ambulatory Visit: Payer: Self-pay | Admitting: Hematology and Oncology

## 2025-05-17 ENCOUNTER — Inpatient Hospital Stay: Attending: Hematology and Oncology | Admitting: Hematology and Oncology

## 2025-08-05 ENCOUNTER — Encounter (INDEPENDENT_AMBULATORY_CARE_PROVIDER_SITE_OTHER): Admitting: Ophthalmology
